# Patient Record
Sex: Female | Born: 2000 | Race: White | Hispanic: No | Marital: Single | Smoking: Never smoker
Health system: Southern US, Community
[De-identification: ages and names within clinical notes are randomized; demographics above are authoritative.]

## PROBLEM LIST (undated history)

## (undated) DIAGNOSIS — R87629 Unspecified abnormal cytological findings in specimens from vagina: Secondary | ICD-10-CM

## (undated) DIAGNOSIS — K219 Gastro-esophageal reflux disease without esophagitis: Secondary | ICD-10-CM

## (undated) DIAGNOSIS — F419 Anxiety disorder, unspecified: Secondary | ICD-10-CM

## (undated) DIAGNOSIS — N289 Disorder of kidney and ureter, unspecified: Secondary | ICD-10-CM

## (undated) HISTORY — DX: Unspecified abnormal cytological findings in specimens from vagina: R87.629

## (undated) HISTORY — PX: MULTIPLE TOOTH EXTRACTIONS: SHX2053

## (undated) HISTORY — DX: Gastro-esophageal reflux disease without esophagitis: K21.9

---
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
## 2018-02-16 ENCOUNTER — Encounter (HOSPITAL_BASED_OUTPATIENT_CLINIC_OR_DEPARTMENT_OTHER): Payer: Self-pay | Admitting: *Deleted

## 2018-02-16 ENCOUNTER — Emergency Department (HOSPITAL_BASED_OUTPATIENT_CLINIC_OR_DEPARTMENT_OTHER)
Admission: EM | Admit: 2018-02-16 | Discharge: 2018-02-17 | Disposition: A | Discharge status: Home / Self Care | Payer: BC Managed Care – PPO | Attending: Emergency Medicine | Admitting: Emergency Medicine

## 2018-02-16 ENCOUNTER — Other Ambulatory Visit: Payer: Self-pay

## 2018-02-16 DIAGNOSIS — R55 Syncope and collapse: Secondary | ICD-10-CM | POA: Insufficient documentation

## 2018-02-16 LAB — CBC WITH DIFFERENTIAL/PLATELET
Abs Immature Granulocytes: 0.02 10*3/uL (ref 0.00–0.07)
Basophils Absolute: 0.1 10*3/uL (ref 0.0–0.1)
Basophils Relative: 1 %
Eosinophils Absolute: 0.1 10*3/uL (ref 0.0–0.5)
Eosinophils Relative: 1 %
HCT: 44.3 % (ref 36.0–46.0)
Hemoglobin: 14.6 g/dL (ref 12.0–15.0)
Immature Granulocytes: 0 %
Lymphocytes Relative: 26 %
Lymphs Abs: 2.4 10*3/uL (ref 0.7–4.0)
MCH: 30.2 pg (ref 26.0–34.0)
MCHC: 33 g/dL (ref 30.0–36.0)
MCV: 91.7 fL (ref 80.0–100.0)
Monocytes Absolute: 1.4 10*3/uL — ABNORMAL HIGH (ref 0.1–1.0)
Monocytes Relative: 15 %
Neutro Abs: 5.2 10*3/uL (ref 1.7–7.7)
Neutrophils Relative %: 57 %
Platelets: 284 10*3/uL (ref 150–400)
RBC: 4.83 MIL/uL (ref 3.87–5.11)
RDW: 12.5 % (ref 11.5–15.5)
WBC: 9.2 10*3/uL (ref 4.0–10.5)
nRBC: 0 % (ref 0.0–0.2)

## 2018-02-16 LAB — BASIC METABOLIC PANEL
Anion gap: 9 (ref 5–15)
BUN: 10 mg/dL (ref 6–20)
CO2: 23 mmol/L (ref 22–32)
Calcium: 9.7 mg/dL (ref 8.9–10.3)
Chloride: 103 mmol/L (ref 98–111)
Creatinine, Ser: 0.68 mg/dL (ref 0.44–1.00)
GFR calc Af Amer: >60 mL/min (ref >60)
GFR calc non Af Amer: >60 mL/min (ref >60)
Glucose, Bld: 83 mg/dL (ref 70–99)
Potassium: 3.6 mmol/L (ref 3.5–5.1)
Sodium: 135 mmol/L (ref 135–145)

## 2018-02-16 LAB — CBG MONITORING, ED: Glucose-Capillary: 76 mg/dL (ref 70–99)

## 2018-02-16 MED ORDER — SODIUM CHLORIDE 0.9 % IV BOLUS
1000.0000 mL | Freq: Once | INTRAVENOUS | Status: AC
  Administered 2018-02-16: 1000 mL via INTRAVENOUS

## 2018-02-16 NOTE — ED Triage Notes (Signed)
Lightheaded. Sudden onset of not feeling good per friends. She denies increased stress. Nausea, sore throat, and abdominal pain. She is flushed. Slow to respond to questions. Pupils equal and slow to react.

## 2018-02-16 NOTE — ED Provider Notes (Signed)
[facility] EMERGENCY DEPARTMENT Provider Note  CSN: [id number] Arrival date & time: 02/16/18 2242  Chief Complaint(s) No chief complaint on file.  HPI Becky Mclaughlin is a 17 y.o. female   The history is provided by the patient.  Dizziness  Quality:  Lightheadedness Severity:  Moderate Onset quality:  Sudden Duration:  1 hour Timing:  Constant Progression:  Unchanged Chronicity:  New Context: medication (took Diflucan yesterday for a yeast infection)   Context: not when bending over, not with bowel movement, not with head movement, not with loss of consciousness, not with physical activity and not when urinating   Context comment:  Reports that she was dying her friend's hair when this started Relieved by:  Nothing Worsened by:  Nothing Associated symptoms: nausea   Associated symptoms: no blood in stool, no diarrhea, no headaches, no palpitations, no shortness of breath, no syncope, no tinnitus, no vomiting and no weakness     LMP: 02/07/18  Past Medical History History reviewed. No pertinent past medical history. There are no active problems to display for this patient.  Home Medication(s) Prior to Admission medications   Not on File                                                                                                                                    Past Surgical History History reviewed. No pertinent surgical history. Family History No family history on file.  Social History Social History   Tobacco Use  . Smoking status: Never Smoker  . Smokeless tobacco: Never Used  Substance Use Topics  . Alcohol use: Never    Frequency: Never  . Drug use: Never   Allergies Patient has no known allergies.  Review of Systems Review of Systems  HENT: Negative for tinnitus.   Respiratory: Negative for shortness of breath.   Cardiovascular: Negative for palpitations and syncope.  Gastrointestinal: Positive for nausea. Negative for blood in stool,  diarrhea and vomiting.  Neurological: Positive for dizziness. Negative for weakness and headaches.   All other systems are reviewed and are negative for acute change except as noted in the HPI  Physical Exam Vital Signs  I have reviewed the triage vital signs BP (!) 154/107 (BP Location: Left Arm)   Pulse (!) 119   Temp 98.3 F (36.8 C) (Oral)   Resp 16   Ht 5\' 9"  (1.753 m)   Wt 57.6 kg   LMP 02/09/2018   SpO2 100%   BMI 18.75 kg/m   Physical Exam Vitals signs reviewed.  Constitutional:      General: She is not in acute distress.    Appearance: She is well-developed. She is not diaphoretic.  HENT:     Head: Normocephalic and atraumatic.     Nose: Nose normal.  Eyes:     General: No scleral icterus.       Right eye: No discharge.  Left eye: No discharge.     Conjunctiva/sclera: Conjunctivae normal.     Pupils: Pupils are equal, round, and reactive to light.  Neck:     Musculoskeletal: Normal range of motion and neck supple.  Cardiovascular:     Rate and Rhythm: Regular rhythm. Tachycardia present.     Heart sounds: No murmur. No friction rub. No gallop.   Pulmonary:     Effort: Pulmonary effort is normal. No respiratory distress.     Breath sounds: Normal breath sounds. No stridor. No rales.  Abdominal:     General: There is no distension.     Palpations: Abdomen is soft.     Tenderness: There is no abdominal tenderness.  Musculoskeletal:        General: No tenderness.  Skin:    General: Skin is warm and dry.     Findings: No erythema or rash.  Neurological:     Mental Status: She is alert and oriented to person, place, and time.     ED Results and Treatments Labs (all labs ordered are listed, but only abnormal results are displayed) Labs Reviewed  CBC WITH DIFFERENTIAL/PLATELET - Abnormal; Notable for the following components:      Result Value   Monocytes Absolute 1.4 (*)    All other components within normal limits  BASIC METABOLIC PANEL    PREGNANCY, URINE  CBG MONITORING, ED  CBG MONITORING, ED                                                                                                                         EKG  EKG Interpretation  Date/Time:  Friday February 16 2018 23:25:11 EST Ventricular Rate:  102 PR Interval:    QRS Duration: 84 QT Interval:  319 QTC Calculation: 416 R Axis:   116 Text Interpretation:  Sinus tachycardia Right axis deviation Low voltage, precordial leads Borderline T abnormalities, diffuse leads No old tracing to compare Confirmed by Drema Pryardama, Pedro [phone]([id number]) on 02/17/2018 2:51:44 AM      Radiology No results found. Pertinent labs & imaging results that were available during my care of the patient were reviewed by me and considered in my medical decision making (see chart for details).  Medications Ordered in ED Medications  sodium chloride 0.9 % bolus 1,000 mL ( Intravenous Stopped 02/17/18 0035)  alum & mag hydroxide-simeth (MAALOX/MYLANTA) 200-200-20 MG/5ML suspension 30 mL (30 mLs Oral Given 02/17/18 0214)  Procedures Procedures  (including critical care time)  Medical Decision Making / ED Course I have reviewed the nursing notes for this encounter and the patient's prior records (if available in EHR or on provided paperwork).    Patient presents with near syncopal episode.  EKG without dysrhythmias or blocks.  No evidence of Brugada or hokum.  Labs grossly reassuring without significant electrolyte derangements or renal insufficiency.  No anemia.  No leukocytosis.  CBGs reassuring.  UPT negative.  Blood pressure and orthostatics reassuring.  Patient is mildly tachycardic.  She was provided with IV fluids.  During work-up she complained of epigastric discomfort which improved after GI cocktail.  Following treatment, tachycardia resolved.  Patient improved  symptomatically and was able to ambulate without complication.  After several hours of monitoring, felt the patient was stable for discharge home with strict return precautions.  The patient appears reasonably screened and/or stabilized for discharge and I doubt any other medical condition or other [facility] requiring further screening, evaluation, or treatment in the ED at this time prior to discharge.   Final Clinical Impression(s) / ED Diagnoses Final diagnoses:  Vasovagal near syncope    Disposition: Discharge  Condition: Good  I have discussed the results, Dx and Tx plan with the patient who expressed understanding and agree(s) with the plan. Discharge instructions discussed at great length. The patient was given strict return precautions who verbalized understanding of the instructions. No further questions at time of discharge.    ED Discharge Orders    None       Follow Up: Iona HansenJones, Penny L, NP [address] [place] [address] [place] [id number] [phone]  Schedule an appointment as soon as possible for a visit  As needed     This chart was dictated using voice recognition software.  Despite best efforts to proofread,  errors can occur which can change the documentation meaning.   Nira Connardama, Pedro Eduardo, MD 02/17/18 [phone]

## 2018-02-17 LAB — PREGNANCY, URINE: Preg Test, Ur: NEGATIVE

## 2018-02-17 LAB — CBG MONITORING, ED: Glucose-Capillary: 91 mg/dL (ref 70–99)

## 2018-02-17 MED ORDER — ALUM & MAG HYDROXIDE-SIMETH 200-200-20 MG/5ML PO SUSP
30.0000 mL | Freq: Once | ORAL | Status: AC
  Administered 2018-02-17: 30 mL via ORAL
  Filled 2018-02-17: qty 30

## 2018-02-23 ENCOUNTER — Other Ambulatory Visit: Payer: Self-pay

## 2018-02-23 ENCOUNTER — Encounter (HOSPITAL_BASED_OUTPATIENT_CLINIC_OR_DEPARTMENT_OTHER): Payer: Self-pay

## 2018-02-23 ENCOUNTER — Emergency Department (HOSPITAL_BASED_OUTPATIENT_CLINIC_OR_DEPARTMENT_OTHER)
Admission: EM | Admit: 2018-02-23 | Discharge: 2018-02-23 | Disposition: A | Discharge status: Home / Self Care | Payer: BC Managed Care – PPO | Attending: Emergency Medicine | Admitting: Emergency Medicine

## 2018-02-23 ENCOUNTER — Emergency Department (HOSPITAL_BASED_OUTPATIENT_CLINIC_OR_DEPARTMENT_OTHER): Payer: BC Managed Care – PPO

## 2018-02-23 DIAGNOSIS — R079 Chest pain, unspecified: Secondary | ICD-10-CM | POA: Diagnosis not present

## 2018-02-23 LAB — CBC WITH DIFFERENTIAL/PLATELET
Abs Immature Granulocytes: 0.01 10*3/uL (ref 0.00–0.07)
Basophils Absolute: 0 10*3/uL (ref 0.0–0.1)
Basophils Relative: 0 %
Eosinophils Absolute: 0 10*3/uL (ref 0.0–0.5)
Eosinophils Relative: 0 %
HCT: 47 % — ABNORMAL HIGH (ref 36.0–46.0)
Hemoglobin: 15.5 g/dL — ABNORMAL HIGH (ref 12.0–15.0)
Immature Granulocytes: 0 %
Lymphocytes Relative: 37 %
Lymphs Abs: 2.1 10*3/uL (ref 0.7–4.0)
MCH: 30 pg (ref 26.0–34.0)
MCHC: 33 g/dL (ref 30.0–36.0)
MCV: 90.9 fL (ref 80.0–100.0)
Monocytes Absolute: 0.6 10*3/uL (ref 0.1–1.0)
Monocytes Relative: 10 %
Neutro Abs: 3 10*3/uL (ref 1.7–7.7)
Neutrophils Relative %: 53 %
Platelets: 267 10*3/uL (ref 150–400)
RBC: 5.17 MIL/uL — ABNORMAL HIGH (ref 3.87–5.11)
RDW: 12.5 % (ref 11.5–15.5)
WBC: 5.7 10*3/uL (ref 4.0–10.5)
nRBC: 0 % (ref 0.0–0.2)

## 2018-02-23 LAB — COMPREHENSIVE METABOLIC PANEL
ALT: 12 U/L (ref 0–44)
AST: 21 U/L (ref 15–41)
Albumin: 4.1 g/dL (ref 3.5–5.0)
Alkaline Phosphatase: 67 U/L (ref 38–126)
Anion gap: 9 (ref 5–15)
BUN: 9 mg/dL (ref 6–20)
CO2: 23 mmol/L (ref 22–32)
Calcium: 9.5 mg/dL (ref 8.9–10.3)
Chloride: 105 mmol/L (ref 98–111)
Creatinine, Ser: 0.62 mg/dL (ref 0.44–1.00)
GFR calc Af Amer: >60 mL/min (ref >60)
GFR calc non Af Amer: >60 mL/min (ref >60)
Glucose, Bld: 82 mg/dL (ref 70–99)
Potassium: 4 mmol/L (ref 3.5–5.1)
Sodium: 137 mmol/L (ref 135–145)
Total Bilirubin: 0.5 mg/dL (ref 0.3–1.2)
Total Protein: 7.6 g/dL (ref 6.5–8.1)

## 2018-02-23 LAB — HCG, SERUM, QUALITATIVE: Preg, Serum: NEGATIVE

## 2018-02-23 LAB — PREGNANCY, URINE: Preg Test, Ur: NEGATIVE

## 2018-02-23 LAB — D-DIMER, QUANTITATIVE: D-Dimer, Quant: <0.27 ug/mL-FEU (ref 0.00–0.50)

## 2018-02-23 MED ORDER — IOPAMIDOL (ISOVUE-370) INJECTION 76%
100.0000 mL | Freq: Once | INTRAVENOUS | Status: AC | PRN
  Administered 2018-02-23: 57 mL via INTRAVENOUS

## 2018-02-23 NOTE — ED Triage Notes (Signed)
Pt c/o left side chest pain that started last night, pt is uncertain if she is having SOB or dizziness, was seen at the [facility] and had an elevated d-dimer

## 2018-02-23 NOTE — ED Notes (Signed)
Pt and family understood dc material. NAD noted 

## 2018-02-23 NOTE — Discharge Instructions (Addendum)
Your CT chest today was negative for a pulmonary embolism. Please follow up with your pediatrician as needed.

## 2018-02-23 NOTE — ED Provider Notes (Signed)
[facility] EMERGENCY DEPARTMENT Provider Note   CSN: [id number] Arrival date & time: 02/23/18  1508     History   Chief Complaint Chief Complaint  Patient presents with  . Abnormal Lab    HPI Becky Mclaughlin is a 18 y.o. female.  18 y.o female with a PMH of GERD presents to the ED with a chief complaint of chest pain which began last night while watching television. She reports some bilateral hand tingling. She reports the pain is worse with taking a deep breath.  Patient has not tried taking any medication for relief of her chest pain.  She does however report she takes omeprazole for acid reflux which she took today.  Patient is currently on oral contraceptives which were started in November, she denies any recent immobilization aside from a long car ride to [place] back in November.  Ports her pain is currently at rest and while walking.  He denies any fever, chills, shortness of breath or nausea vomiting.     History reviewed. No pertinent past medical history.  There are no active problems to display for this patient.   History reviewed. No pertinent surgical history.   OB History   No obstetric history on file.      Home Medications    Prior to Admission medications   Not on File    Family History No family history on file.  Social History Social History   Tobacco Use  . Smoking status: Never Smoker  . Smokeless tobacco: Never Used  Substance Use Topics  . Alcohol use: Never    Frequency: Never  . Drug use: Never     Allergies   Patient has no known allergies.   Review of Systems Review of Systems  Constitutional: Negative for chills and fever.  HENT: Negative for ear pain and sore throat.   Eyes: Negative for pain and visual disturbance.  Respiratory: Negative for cough and shortness of breath.   Cardiovascular: Positive for chest pain. Negative for palpitations and leg swelling.  Gastrointestinal: Negative for abdominal pain and  vomiting.  Genitourinary: Negative for dysuria and hematuria.  Musculoskeletal: Negative for arthralgias and back pain.  Skin: Negative for color change and rash.  Neurological: Negative for seizures and syncope.  All other systems reviewed and are negative.    Physical Exam Updated Vital Signs BP (!) 136/93 (BP Location: Left Arm)   Pulse 86   Temp 98.7 F (37.1 C) (Oral)   Resp 18   Ht 5\' 9"  (1.753 m)   Wt 56.7 kg   LMP 02/06/2018   SpO2 100%   BMI 18.46 kg/m   Physical Exam Vitals signs and nursing note reviewed.  Constitutional:      General: She is not in acute distress.    Appearance: She is well-developed.  HENT:     Head: Normocephalic and atraumatic.     Mouth/Throat:     Pharynx: No oropharyngeal exudate.  Eyes:     Pupils: Pupils are equal, round, and reactive to light.  Neck:     Musculoskeletal: Normal range of motion.  Cardiovascular:     Rate and Rhythm: Regular rhythm.     Heart sounds: Normal heart sounds.  Pulmonary:     Effort: Pulmonary effort is normal. No respiratory distress.     Breath sounds: Normal breath sounds.  Chest:     Comments: Pain is not reproducible with palpation. Abdominal:     General: Bowel sounds are normal. There is  no distension.     Palpations: Abdomen is soft.     Tenderness: There is no abdominal tenderness.  Musculoskeletal:        General: No tenderness or deformity.     Right lower leg: No edema.     Left lower leg: No edema.  Skin:    General: Skin is warm and dry.  Neurological:     Mental Status: She is alert and oriented to person, place, and time.      ED Treatments / Results  Labs (all labs ordered are listed, but only abnormal results are displayed) Labs Reviewed  CBC WITH DIFFERENTIAL/PLATELET - Abnormal; Notable for the following components:      Result Value   RBC 5.17 (*)    Hemoglobin 15.5 (*)    HCT 47.0 (*)    All other components within normal limits  PREGNANCY, URINE  HCG, SERUM,  QUALITATIVE  COMPREHENSIVE METABOLIC PANEL    EKG None  Radiology Ct Angio Chest Pe W And/or Wo Contrast  Addendum Date: 02/23/2018   ADDENDUM REPORT: 02/23/2018 18:13 ADDENDUM: This addendum is given for the purpose of noting the patient's aorta is normal in appearance without dissection or aneurysm. Electronically Signed   By: Drusilla Kannerhomas  Dalessio M.D.   On: 02/23/2018 18:13   Result Date: 02/23/2018 CLINICAL DATA:  Left chest pain, elevated D-dimer and near syncopal episode. Chest pain last night. EXAM: CT ANGIOGRAPHY CHEST WITH CONTRAST TECHNIQUE: Multidetector CT imaging of the chest was performed using the standard protocol during bolus administration of intravenous contrast. Multiplanar CT image reconstructions and MIPs were obtained to evaluate the vascular anatomy. CONTRAST:  57 mL ISOVUE-370 IOPAMIDOL (ISOVUE-370) INJECTION 76% COMPARISON:  None. FINDINGS: Cardiovascular: Satisfactory opacification of the pulmonary arteries to the segmental level. No evidence of pulmonary embolism. Normal heart size. No pericardial effusion. Mediastinum/Nodes: No enlarged mediastinal, hilar, or axillary lymph nodes. Thyroid gland, trachea, and esophagus demonstrate no significant findings. Lungs/Pleura: Lungs are clear. No pleural effusion or pneumothorax. Upper Abdomen: Negative. Musculoskeletal: Negative. Review of the MIP images confirms the above findings. IMPRESSION: Negative for pulmonary embolus.  Normal chest CT. Electronically Signed: By: Drusilla Kannerhomas  Dalessio M.D. On: 02/23/2018 17:43    Procedures Procedures (including critical care time)  Medications Ordered in ED Medications  iopamidol (ISOVUE-370) 76 % injection 100 mL (57 mLs Intravenous Contrast Given 02/23/18 1720)     Initial Impression / Assessment and Plan / ED Course  I have reviewed the triage vital signs and the nursing notes.  Pertinent labs & imaging results that were available during my care of the patient were reviewed by me and  considered in my medical decision making (see chart for details).    Presents with pain since last night and a near syncopal episode.  She is on birth control but has recently stopped them.  States her pain is worse with deep inspiration.  She was seen at [facility] urgent [facility] this evening had an elevated d-dimer of 5710, CT angio chest to rule out PE was ordered during her visit. CBC showed no leukocytosis, CMP showed no electrolyte abnormality.  Urine pregnancy was negative, her EKG no acute changes consistent with infarct or STEMI.  CT angio chest showed: Negative for pulmonary embolus. Normal chest CT. This addendum is given for the purpose of noting the patient's aorta is normal in appearance without dissection or aneurysm.   Final Clinical Impressions(s) / ED Diagnoses   Final diagnoses:  Chest pain, unspecified type    ED  Discharge Orders    None       Claude MangesSoto, , Cordelia Poche-C 02/23/18 [id number]    Campos, Kevin, MD 02/25/18 [phone]

## 2018-03-07 ENCOUNTER — Other Ambulatory Visit: Payer: Self-pay | Admitting: Physician Assistant

## 2018-03-07 DIAGNOSIS — R110 Nausea: Secondary | ICD-10-CM

## 2018-03-07 DIAGNOSIS — R1010 Upper abdominal pain, unspecified: Secondary | ICD-10-CM

## 2018-03-07 DIAGNOSIS — R142 Eructation: Secondary | ICD-10-CM

## 2018-03-13 ENCOUNTER — Other Ambulatory Visit: Payer: BC Managed Care – PPO

## 2018-03-16 ENCOUNTER — Other Ambulatory Visit: Payer: BC Managed Care – PPO

## 2018-03-16 ENCOUNTER — Inpatient Hospital Stay: Admission: RE | Admit: 2018-03-16 | Payer: BC Managed Care – PPO | Source: Ambulatory Visit

## 2018-03-21 DIAGNOSIS — R519 Headache, unspecified: Secondary | ICD-10-CM | POA: Insufficient documentation

## 2018-03-23 ENCOUNTER — Ambulatory Visit
Admission: RE | Admit: 2018-03-23 | Discharge: 2018-03-23 | Disposition: A | Discharge status: Home / Self Care | Payer: BC Managed Care – PPO | Source: Ambulatory Visit | Attending: Physician Assistant | Admitting: Physician Assistant

## 2018-03-23 DIAGNOSIS — R110 Nausea: Secondary | ICD-10-CM

## 2018-03-23 DIAGNOSIS — R142 Eructation: Secondary | ICD-10-CM

## 2018-03-23 DIAGNOSIS — R1010 Upper abdominal pain, unspecified: Secondary | ICD-10-CM

## 2018-05-09 DIAGNOSIS — K219 Gastro-esophageal reflux disease without esophagitis: Secondary | ICD-10-CM | POA: Insufficient documentation

## 2018-10-21 NOTE — Progress Notes (Signed)
Patient referred by Berkley Harvey, NP for chest pain  Subjective:   Becky Mclaughlin, female    DOB: [birth year], 18 y.o.   MRN: [id number]   Chief Complaint  Patient presents with  . Chest Pain     HPI  18 y.o. Caucasian female with chest pain, lighthheadedness  Patient is a Ship broker at [employer], currently enrolled in virtual classes.  She lives with her mother.  She has had episodes of left-sided sharp chest pain lasting for few seconds to minutes, unrelated to exertion.  She has also had episodes of sudden onset lightheadedness with palpitations, but improved with laying down in supine position.  She has never had a syncope.  Patient has undergone CT scan of her chest in January after an ED visit that showed no significant abnormalities.  Her EKG has historically showed inferior T wave inversions with no change.   Past Medical History:  Diagnosis Date  . Acid reflux      History reviewed. No pertinent surgical history.   Social History   Socioeconomic History  . Marital status: Single    Spouse name: Not on file  . Number of children: Not on file  . Years of education: Not on file  . Highest education level: Not on file  Occupational History  . Not on file  Social Needs  . Financial resource strain: Not on file  . Food insecurity    Worry: Not on file    Inability: Not on file  . Transportation needs    Medical: Not on file    Non-medical: Not on file  Tobacco Use  . Smoking status: Never Smoker  . Smokeless tobacco: Never Used  Substance and Sexual Activity  . Alcohol use: Never    Frequency: Never  . Drug use: Never  . Sexual activity: Not on file  Lifestyle  . Physical activity    Days per week: Not on file    Minutes per session: Not on file  . Stress: Not on file  Relationships  . Social Herbalist on phone: Not on file    Gets together: Not on file    Attends religious service: Not on file    Active member of club or organization: Not on  file    Attends meetings of clubs or organizations: Not on file    Relationship status: Not on file  . Intimate partner violence    Fear of current or ex partner: Not on file    Emotionally abused: Not on file    Physically abused: Not on file    Forced sexual activity: Not on file  Other Topics Concern  . Not on file  Social History Narrative  . Not on file     Family History  Problem Relation Age of Onset  . Hypertension Mother        "had hole in her heart"  . Hypertension Father   . Stroke Cousin        At age 93., used to vape    Current Outpatient Medications on File Prior to Visit  Medication Sig Dispense Refill  . omeprazole (PRILOSEC) 40 MG capsule Take 1 capsule by mouth 2 (two) times daily.     No current facility-administered medications on file prior to visit.     Cardiovascular studies:  EKG 10/22/2018: Sinus rhythm 67 bpm. Diffuse nonspecific T-abnormality.   CTA chest 02/23/2018: Negative for pulmonary embolus. Normal chest CT. Normal aorta.  Recent labs: 10/05/2018: Glucose 82. BUN/Cr 10/0.75. eGFR norma. Na/K 137/4.0 Rest of the CMP normal.   Results for Becky Mclaughlin (MRN [id number]) as of 10/21/2018 22:31  Ref. Range 02/23/2018 16:15  Sodium Latest Ref Range: 135 - 145 mmol/L 137  Potassium Latest Ref Range: 3.5 - 5.1 mmol/L 4.0  Chloride Latest Ref Range: 98 - 111 mmol/L 105  CO2 Latest Ref Range: 22 - 32 mmol/L 23  Glucose Latest Ref Range: 70 - 99 mg/dL 82  BUN Latest Ref Range: 6 - 20 mg/dL 9  Creatinine Latest Ref Range: 0.44 - 1.00 mg/dL 0.62  Calcium Latest Ref Range: 8.9 - 10.3 mg/dL 9.5  Anion gap Latest Ref Range: 5 - 15  9  Alkaline Phosphatase Latest Ref Range: 38 - 126 U/L 67  Albumin Latest Ref Range: 3.5 - 5.0 g/dL 4.1  AST Latest Ref Range: 15 - 41 U/L 21  ALT Latest Ref Range: 0 - 44 U/L 12  Total Protein Latest Ref Range: 6.5 - 8.1 g/dL 7.6  Total Bilirubin Latest Ref Range: 0.3 - 1.2 mg/dL 0.5  GFR, Est Non African  American Latest Ref Range: >60 mL/min >60  GFR, Est African American Latest Ref Range: >60 mL/min >60   Results for Becky Mclaughlin (MRN [id number]) as of 10/21/2018 22:31  Ref. Range 02/23/2018 16:15  WBC Latest Ref Range: 4.0 - 10.5 K/uL 5.7  RBC Latest Ref Range: 3.87 - 5.11 MIL/uL 5.17 (H)  Hemoglobin Latest Ref Range: 12.0 - 15.0 g/dL 15.5 (H)  HCT Latest Ref Range: 36.0 - 46.0 % 47.0 (H)  MCV Latest Ref Range: 80.0 - 100.0 fL 90.9  MCH Latest Ref Range: 26.0 - 34.0 pg 30.0  MCHC Latest Ref Range: 30.0 - 36.0 g/dL 33.0  RDW Latest Ref Range: 11.5 - 15.5 % 12.5  Platelets Latest Ref Range: 150 - 400 K/uL 267  nRBC Latest Ref Range: 0.0 - 0.2 % 0.0   Results for Becky Mclaughlin (MRN [id number]) as of 10/21/2018 22:31  Ref. Range 02/23/2018 16:15  D-Dimer, Quant Latest Ref Range: 0.00 - 0.50 ug/mL-FEU <0.27    Review of Systems  Constitution: Negative for decreased appetite, malaise/fatigue, weight gain and weight loss.  HENT: Negative for congestion.   Eyes: Negative for visual disturbance.  Cardiovascular: Positive for chest pain and palpitations. Negative for dyspnea on exertion, leg swelling and syncope.  Respiratory: Negative for cough.   Endocrine: Negative for cold intolerance.  Hematologic/Lymphatic: Does not bruise/bleed easily.  Skin: Negative for itching and rash.  Musculoskeletal: Negative for myalgias.  Gastrointestinal: Negative for abdominal pain, nausea and vomiting.  Genitourinary: Negative for dysuria.  Neurological: Positive for light-headedness. Negative for dizziness and weakness.  Psychiatric/Behavioral: The patient is not nervous/anxious.   All other systems reviewed and are negative.      Vitals:   10/22/18 1038  BP: 118/82  Pulse: 83  Temp: 97.8 F (36.6 C)  SpO2: 99%     Body mass index is 18.9 kg/m. Filed Weights   10/22/18 1038  Weight: 128 lb (58.1 kg)     Objective:   Physical Exam  Constitutional: She is oriented to person, place, and  time. She appears well-developed and well-nourished. No distress.  HENT:  Head: Normocephalic and atraumatic.  Eyes: Pupils are equal, round, and reactive to light. Conjunctivae are normal.  Neck: No JVD present.  Cardiovascular: Normal rate, regular rhythm and intact distal pulses.  Pulmonary/Chest: Effort normal and breath sounds normal. She has no wheezes. She has no  rales.  Abdominal: Soft. Bowel sounds are normal. There is no rebound.  Musculoskeletal:        General: No edema.  Lymphadenopathy:    She has no cervical adenopathy.  Neurological: She is alert and oriented to person, place, and time. No cranial nerve deficit.  Skin: Skin is warm and dry.  Psychiatric: She has a normal mood and affect.  Nursing note and vitals reviewed.         Assessment & Recommendations:   18 y.o. Caucasian female with chest pain, lighthheadedness  Chest pain: Atypical in nature.  Recommend treadmill stress test to reassure the patient regarding her exercise capacity and rule out any significant cardiac abnormalities.  Given her baseline given the lesions on EKG, will obtain echocardiogram.  Lightheadedness: Suspect her episodes are vasovagal in nature.  Discussed the mechanism and recommended liberal hydration, use of compression stockings.  We also discussed counterpressure maneuvers.   Thank you for referring the patient to [place]. Please feel free to contact with any questions.  Nigel Mormon, MD [facility] Pager: [phone] Office: [phone] If no answer Cell [phone]

## 2018-10-22 ENCOUNTER — Ambulatory Visit (INDEPENDENT_AMBULATORY_CARE_PROVIDER_SITE_OTHER): Payer: BC Managed Care – PPO | Admitting: Cardiology

## 2018-10-22 ENCOUNTER — Encounter: Payer: Self-pay | Admitting: Cardiology

## 2018-10-22 ENCOUNTER — Other Ambulatory Visit: Payer: Self-pay

## 2018-10-22 VITALS — BP 118/82 | HR 83 | Temp 97.8°F | Ht 69.0 in | Wt 128.0 lb

## 2018-10-22 DIAGNOSIS — R42 Dizziness and giddiness: Secondary | ICD-10-CM

## 2018-10-22 DIAGNOSIS — R079 Chest pain, unspecified: Secondary | ICD-10-CM | POA: Insufficient documentation

## 2018-10-22 DIAGNOSIS — R072 Precordial pain: Secondary | ICD-10-CM | POA: Insufficient documentation

## 2018-10-24 ENCOUNTER — Telehealth: Payer: Self-pay

## 2018-10-24 NOTE — Telephone Encounter (Signed)
Telephone encounter:  Reason for call: should patient stop newly prescribe hydroxyzine prior to upcoming Echo & Nuclear Stress Test  Usual provider: MP   Last office visit: 10/22/18    Next office visit: echo/nuc    Last hospitalization: 02/16/18     Current Outpatient Medications on File Prior to Visit  Medication Sig Dispense Refill  . hydrOXYzine (ATARAX/VISTARIL) 10 MG tablet Take 10 mg by mouth daily.    Marland Kitchen omeprazole (PRILOSEC) 40 MG capsule Take 1 capsule by mouth 2 (two) times daily.     No current facility-administered medications on file prior to visit.

## 2018-10-24 NOTE — Telephone Encounter (Signed)
S/w pt advised her pf above

## 2018-10-31 ENCOUNTER — Other Ambulatory Visit: Payer: Self-pay | Admitting: Physician Assistant

## 2018-10-31 DIAGNOSIS — R1010 Upper abdominal pain, unspecified: Secondary | ICD-10-CM

## 2018-10-31 DIAGNOSIS — R1900 Intra-abdominal and pelvic swelling, mass and lump, unspecified site: Secondary | ICD-10-CM

## 2018-11-02 ENCOUNTER — Other Ambulatory Visit: Payer: Self-pay

## 2018-11-02 ENCOUNTER — Emergency Department (HOSPITAL_BASED_OUTPATIENT_CLINIC_OR_DEPARTMENT_OTHER)
Admission: EM | Admit: 2018-11-02 | Discharge: 2018-11-02 | Disposition: A | Discharge status: Home / Self Care | Payer: BC Managed Care – PPO | Attending: Emergency Medicine | Admitting: Emergency Medicine

## 2018-11-02 ENCOUNTER — Emergency Department (HOSPITAL_BASED_OUTPATIENT_CLINIC_OR_DEPARTMENT_OTHER)
Admission: EM | Admit: 2018-11-02 | Discharge: 2018-11-02 | Disposition: A | Discharge status: Home / Self Care | Payer: BC Managed Care – PPO | Source: Home / Self Care | Attending: Emergency Medicine | Admitting: Emergency Medicine

## 2018-11-02 ENCOUNTER — Emergency Department (HOSPITAL_BASED_OUTPATIENT_CLINIC_OR_DEPARTMENT_OTHER): Payer: BC Managed Care – PPO

## 2018-11-02 ENCOUNTER — Encounter (HOSPITAL_BASED_OUTPATIENT_CLINIC_OR_DEPARTMENT_OTHER): Payer: Self-pay | Admitting: *Deleted

## 2018-11-02 ENCOUNTER — Encounter (HOSPITAL_BASED_OUTPATIENT_CLINIC_OR_DEPARTMENT_OTHER): Payer: Self-pay | Admitting: Emergency Medicine

## 2018-11-02 DIAGNOSIS — R0789 Other chest pain: Secondary | ICD-10-CM | POA: Insufficient documentation

## 2018-11-02 DIAGNOSIS — R1013 Epigastric pain: Secondary | ICD-10-CM

## 2018-11-02 DIAGNOSIS — R002 Palpitations: Secondary | ICD-10-CM

## 2018-11-02 DIAGNOSIS — Z79899 Other long term (current) drug therapy: Secondary | ICD-10-CM | POA: Diagnosis not present

## 2018-11-02 DIAGNOSIS — R1900 Intra-abdominal and pelvic swelling, mass and lump, unspecified site: Secondary | ICD-10-CM | POA: Insufficient documentation

## 2018-11-02 LAB — URINALYSIS, ROUTINE W REFLEX MICROSCOPIC
Bilirubin Urine: NEGATIVE
Glucose, UA: NEGATIVE mg/dL
Hgb urine dipstick: NEGATIVE
Ketones, ur: 15 mg/dL — AB
Leukocytes,Ua: NEGATIVE
Nitrite: NEGATIVE
Protein, ur: NEGATIVE mg/dL
Specific Gravity, Urine: 1.025 (ref 1.005–1.030)
pH: 6 (ref 5.0–8.0)

## 2018-11-02 LAB — PREGNANCY, URINE: Preg Test, Ur: NEGATIVE

## 2018-11-02 MED ORDER — LIDOCAINE VISCOUS HCL 2 % MT SOLN
15.0000 mL | Freq: Once | OROMUCOSAL | Status: DC
  Filled 2018-11-02: qty 15

## 2018-11-02 MED ORDER — ALUM & MAG HYDROXIDE-SIMETH 200-200-20 MG/5ML PO SUSP
30.0000 mL | Freq: Once | ORAL | Status: DC
  Filled 2018-11-02: qty 30

## 2018-11-02 NOTE — ED Notes (Signed)
Patient declined discharge vitals and signature. Caught up to patient at registration to deliver discharge paperwork.

## 2018-11-02 NOTE — ED Notes (Signed)
PT does not want EKG.

## 2018-11-02 NOTE — Discharge Instructions (Signed)
Your ultrasound today shows no evidence of aneurysm or other abnormalities.  Please follow-up with your GI doctor and cardiologist for continued evaluation of the symptoms.

## 2018-11-02 NOTE — ED Provider Notes (Signed)
[facility] EMERGENCY DEPARTMENT Provider Note   CSN: [id number] Arrival date & time: 11/02/18  1402     History   Chief Complaint Chief Complaint  Patient presents with  . Abdominal Pain    HPI Evolett Kotlarz is a 18 y.o. female with no significant past medical history presenting to the ED with intermittent chest discomfort for past two weeks with heightened awareness of her heartbeat which she reports to be more rapid during these episodes. These episodes subside within 15-20 minutes. She has also been having epigastric discomfort for this duration that is more prominent when she arches her back and presses down on her abdomen. She believes that she has a bulge in her abdomen and is able to feel her aorta through this. She reports that she was told by her GI doctor that she has an abdominal aneurysm. She denies any headaches, dizziness, shortness of breath, diaphoresis, nausea, vomiting, or changes in bowel habits.     Past Medical History:  Diagnosis Date  . Acid reflux     Patient Active Problem List   Diagnosis Date Noted  . Chest pain 10/22/2018  . Lightheadedness 10/22/2018    History reviewed. No pertinent surgical history.   OB History   No obstetric history on file.      Home Medications    Prior to Admission medications   Medication Sig Start Date End Date Taking? Authorizing Provider  famotidine (PEPCID) 10 MG tablet Take 10 mg by mouth 2 (two) times daily.    [provider]  omeprazole (PRILOSEC) 40 MG capsule  11/02/18   [provider]    Family History Family History  Problem Relation Age of Onset  . Hypertension Mother        "had hole in her heart"  . Hypertension Father   . Stroke Cousin        At age 78., used to vape    Social History Social History   Tobacco Use  . Smoking status: Never Smoker  . Smokeless tobacco: Never Used  Substance Use Topics  . Alcohol use: Yes    Frequency: Never    Comment:  occasional  . Drug use: Never     Allergies   Fluconazole   Review of Systems Review of Systems  Constitutional: Negative for appetite change, chills and fever.  HENT: Negative for congestion and sore throat.   Eyes: Negative for pain and visual disturbance.  Respiratory: Positive for chest tightness. Negative for cough, shortness of breath and wheezing.   Cardiovascular: Positive for palpitations. Negative for chest pain.  Gastrointestinal: Positive for abdominal pain. Negative for abdominal distention, constipation, diarrhea, nausea and vomiting.  Endocrine: Negative.   Genitourinary: Negative.   Musculoskeletal: Negative.   Skin: Negative.   Allergic/Immunologic: Negative.   Neurological: Negative for dizziness, weakness, numbness and headaches.  Hematological: Negative.   Psychiatric/Behavioral: Negative.    Physical Exam Updated Vital Signs BP 120/90   Pulse 87   Temp 98.4 F (36.9 C)   Resp 16   Ht 5\' 9"  (1.753 m)   Wt 55 kg   LMP 10/23/2018 (Approximate) Comment: (-) urine pregnancy//ac  SpO2 100%   BMI 17.91 kg/m   Physical Exam Constitutional:      General: She is not in acute distress.    Appearance: She is well-developed and normal weight. She is not diaphoretic.  HENT:     Head: Normocephalic and atraumatic.     Mouth/Throat:     Mouth:  Mucous membranes are moist.     Pharynx: Oropharynx is clear.  Cardiovascular:     Rate and Rhythm: Normal rate and regular rhythm.     Heart sounds: Normal heart sounds. No murmur. No friction rub. No gallop.   Pulmonary:     Effort: Pulmonary effort is normal. No respiratory distress.     Breath sounds: Normal breath sounds. No wheezing, rhonchi or rales.  Chest:     Chest wall: No tenderness.  Abdominal:     General: Abdomen is flat. Bowel sounds are normal. There is no distension or abdominal bruit.     Palpations: Abdomen is soft. There is no mass.     Tenderness: There is abdominal tenderness in the  epigastric area. There is no guarding or rebound.     Hernia: No hernia is present.  Skin:    General: Skin is warm and dry.     Capillary Refill: Capillary refill takes less than 2 seconds.  Neurological:     General: No focal deficit present.     Mental Status: She is alert and oriented to person, place, and time.     Cranial Nerves: No cranial nerve deficit.     Motor: No weakness.    ED Treatments / Results  Labs (all labs ordered are listed, but only abnormal results are displayed) Labs Reviewed - No data to display  EKG None  Radiology Dg Abd Acute 2+v W 1v Chest  Result Date: 11/02/2018 CLINICAL DATA:  Mid abdominal pain, chest pain EXAM: DG ABDOMEN ACUTE W/ 1V CHEST COMPARISON:  None. FINDINGS: There is no evidence of dilated bowel loops or free intraperitoneal air. No radiopaque calculi or other significant radiographic abnormality is seen. Heart size and mediastinal contours are within normal limits. Both lungs are clear. IMPRESSION: Negative abdominal radiographs.  No acute cardiopulmonary disease. Electronically Signed   By: Rolm Baptise M.D.   On: 11/02/2018 03:43    Procedures Procedures (including critical care time)  Medications Ordered in ED Medications - No data to display   Initial Impression / Assessment and Plan / ED Course  I have reviewed the triage vital signs and the nursing notes.  Pertinent labs & imaging results that were available during my care of the patient were reviewed by me and considered in my medical decision making (see chart for details).  Patient is a 18yo female presenting with vague symptoms of intermittent chest pain, palpitations and epigastric abdominal pain and being able to palpate a bulge in her stomach that she believes to be an aortic aneurysm. Patient was seen for these concerns overnight by ED provider. At the time, patient refused EKG due to having an EKG done in the past that has been "normal". She has an upcoming abdominal  ultrasound on 9/16 and was encouraged to follow up with that as well as cardiology recommendations for treadmill stress test and echo. Patient continues to have concerns of chest discomfort and palpitations and being able to palpate bulge in her stomach, insisting on an abdominal US. She continues to refuse EKG.  She is hemodynamically stable on examination. No significant findings on physical exam. US abdomen ordered.     Patient signed out to Benedetto Goad, [place] who will follow up on US Abdomen results. Pending results, patient is likely safe for discharge to home with cardiology and PCP follow up.   Final Clinical Impressions(s) / ED Diagnoses   Final diagnoses:  None    ED Discharge Orders  None       Eliezer BottomAslam, , MD 11/02/18 1511    Jacalyn LefevreHaviland, Julie, MD 11/04/18 [phone]

## 2018-11-02 NOTE — ED Notes (Signed)
ED Provider at bedside. 

## 2018-11-02 NOTE — ED Provider Notes (Signed)
[facility] Provider Note: Becky Spurling, MD, FACEP  CSN: [id number] MRN: [id number] ARRIVAL: 11/02/18 at [place]: [place]  Chest Pain and Abdominal Pain   HISTORY OF PRESENT ILLNESS  11/02/18 2:51 AM Becky Mclaughlin is a 18 y.o. female who complains of a vague chest discomfort that has been occurring off and on for the past 2 weeks.  She has also been having episodes of palpitations (rapid heartbeat) which she thinks, but is not completely sure, occurs at the same time as her chest tightness.  She denies associated shortness of breath, diaphoresis, nausea, vomiting or diarrhea.  She is also been having epigastric discomfort for about a month.  It is not always present at rest but is present when her epigastrium is pressed.  She is not sure if it correlates with her palpitations and chest discomfort.  It is not worse with eating.  She is vague about the nature of this pain and she also believes she has a bulge in the middle of her abdomen which her boyfriend states has been more prominent in the past.  She denies constipation, dysuria, vaginal bleeding or vaginal discharge.  She refuses an EKG because she states she has had EKGs in the past, most recently 2 weeks ago, and they have always been normal.  She rates the pain in her chest as an 8 out of 10 when it is occurring but states it is not occurring at the present time.  She is currently on famotidine having recently switched from omeprazole to full 40 mg twice daily.  She was having the above symptoms even before she changed medications.    Past Medical History:  Diagnosis Date  . Acid reflux     History reviewed. No pertinent surgical history.  Family History  Problem Relation Age of Onset  . Hypertension Mother        "had hole in her heart"  . Hypertension Father   . Stroke Cousin        At age 57., used to vape    Social History   Tobacco Use  . Smoking status: Never Smoker  . Smokeless  tobacco: Never Used  Substance Use Topics  . Alcohol use: Yes    Frequency: Never    Comment: occasional  . Drug use: Never    Prior to Admission medications   Medication Sig Start Date End Date Taking? Authorizing Provider  famotidine (PEPCID) 10 MG tablet Take 10 mg by mouth 2 (two) times daily.   Yes [provider]  hydrOXYzine (ATARAX/VISTARIL) 10 MG tablet Take 10 mg by mouth daily.    [provider]  omeprazole (PRILOSEC) 40 MG capsule Take 1 capsule by mouth 2 (two) times daily. 09/27/18   [provider]    Allergies Fluconazole   REVIEW OF SYSTEMS  Negative except as noted here or in the History of Present Illness.   PHYSICAL EXAMINATION  Initial Vital Signs Blood pressure (!) 142/105, pulse 83, temperature 98.5 F (36.9 C), temperature source Oral, resp. rate 20, height 5\' 9"  (1.753 m), weight 55.8 kg, last menstrual period 10/23/2018, SpO2 100 %.  Examination General: Well-developed, well-nourished female in no acute distress; appearance consistent with age of record HENT: normocephalic; atraumatic Eyes: pupils equal, round and reactive to light; extraocular muscles intact Neck: supple Heart: regular rate and rhythm; no murmur Lungs: clear to auscultation bilaterally Chest: Nontender Abdomen: soft; nondistended; epigastric tenderness; no masses, hernias or hepatosplenomegaly palpated; bowel  sounds present Extremities: No deformity; full range of motion; pulses normal Neurologic: Awake, alert and oriented; motor function intact in all extremities and symmetric; no facial droop Skin: Warm and dry Psychiatric: Normal mood and affect   RESULTS  Summary of this visit's results, reviewed by myself:   EKG Interpretation  Date/Time:    Ventricular Rate:    PR Interval:    QRS Duration:   QT Interval:    QTC Calculation:   R Axis:     Text Interpretation:        Laboratory Studies: Results for orders placed or performed during  the hospital encounter of 11/02/18 (from the past 24 hour(s))  Urinalysis, Routine w reflex microscopic     Status: Abnormal   Collection Time: 11/02/18  3:16 AM  Result Value Ref Range   Color, Urine YELLOW YELLOW   APPearance CLEAR CLEAR   Specific Gravity, Urine 1.025 1.005 - 1.030   pH 6.0 5.0 - 8.0   Glucose, UA NEGATIVE NEGATIVE mg/dL   Hgb urine dipstick NEGATIVE NEGATIVE   Bilirubin Urine NEGATIVE NEGATIVE   Ketones, ur 15 (A) NEGATIVE mg/dL   Protein, ur NEGATIVE NEGATIVE mg/dL   Nitrite NEGATIVE NEGATIVE   Leukocytes,Ua NEGATIVE NEGATIVE  Pregnancy, urine     Status: None   Collection Time: 11/02/18  3:16 AM  Result Value Ref Range   Preg Test, Ur NEGATIVE NEGATIVE   Imaging Studies: Dg Abd Acute 2+v W 1v Chest  Result Date: 11/02/2018 CLINICAL DATA:  Mid abdominal pain, chest pain EXAM: DG ABDOMEN ACUTE W/ 1V CHEST COMPARISON:  None. FINDINGS: There is no evidence of dilated bowel loops or free intraperitoneal air. No radiopaque calculi or other significant radiographic abnormality is seen. Heart size and mediastinal contours are within normal limits. Both lungs are clear. IMPRESSION: Negative abdominal radiographs.  No acute cardiopulmonary disease. Electronically Signed   By: Charlett NoseKevin  Dover M.D.   On: 11/02/2018 03:43    ED COURSE and MDM  Nursing notes and initial vitals signs, including pulse oximetry, reviewed.  Vitals:   11/02/18 0245 11/02/18 0246  BP: (!) 142/105   Pulse: 83   Resp: 20   Temp: 98.5 F (36.9 C)   TempSrc: Oral   SpO2: 100%   Weight:  55.8 kg  Height:  5\' 9"  (1.753 m)   3:59 AM A review of the patient's records indicates she had a cardiology office visit with a Dr. Rosemary HolmsPatwardhan on 10/22/2018 where she complained of sharp, left-sided chest pains lasting seconds to minutes and unrelated to exertion.  She also complained of episodes of lightheadedness and palpitations which improved with lying supine.  It is noted that her EKG has historically  showed inferior T wave inversions which have not changed.  A treadmill stress test was recommended to rule out any significant cardiac abnormalities.  It was also recommended that an echocardiogram be obtained.  She is also seen a gastroenterologist who is concerned about an abdominal aneurysm.  She has an abdominal ultrasound pending.  She was encouraged to follow-up with this as well as the cardiologist's recommendations.  PROCEDURES    ED DIAGNOSES     ICD-10-CM   1. Atypical chest pain  R07.89   2. Palpitations  R00.2   3. Epigastric pain  R10.13        , Jonny RuizJohn, MD 11/02/18 [phone]

## 2018-11-02 NOTE — ED Notes (Signed)
Patient transported to Ultrasound 

## 2018-11-02 NOTE — ED Notes (Addendum)
Pt refused both medications- reports not having acid related pain and already have nexium at home. Pt did not take nexium PTA but will when she leaves.

## 2018-11-02 NOTE — ED Triage Notes (Signed)
Chest pain with abd pain for 2 weeks. Pt vague with sx. NO sob or fever. Denies vomiting.

## 2018-11-02 NOTE — ED Provider Notes (Signed)
Care assumed from Dr. Harvie Heck, please see her note for full details, but in brief Becky Mclaughlin is a 18 y.o. female who presnets with intermittent chest discomfort and epigastric abdominal pain over the past two weeks.  She was evaluated in the ED for the same overnight.  Returns today due to continued symptoms despite reassuring work-up.  Patient requesting ultrasound because she is concerned that she has an abdominal aneurysm.  She has been seen by her PCP and GI doctor as well as cardiology for the symptoms.  Patient well-appearing with normal vitals, plan is to get abdominal ultrasound, if negative patient can be discharged home.  US Abdomen Complete  Result Date: 11/02/2018 CLINICAL DATA:  Worsening paraumbilical abdominal pain for 2 weeks. EXAM: ABDOMEN ULTRASOUND COMPLETE COMPARISON:  None. FINDINGS: Gallbladder: No gallstones or wall thickening visualized. No sonographic Murphy sign noted by sonographer. Common bile duct: Diameter: 3 mm, within normal limits. Liver: No focal lesion identified. Within normal limits in parenchymal echogenicity. Portal vein is patent on color Doppler imaging with normal direction of blood flow towards the liver. IVC: No abnormality visualized. Pancreas: Visualized portion unremarkable. Spleen: Size and appearance within normal limits. Right Kidney: Length: 10.0 cm. Echogenicity within normal limits. No mass or hydronephrosis visualized. Left Kidney: Length: 10.0 cm. Echogenicity within normal limits. No mass or hydronephrosis visualized. Abdominal aorta: No aneurysm visualized. Other findings: None. IMPRESSION: Negative abdomen ultrasound. Electronically Signed   By: Marlaine Hind M.D.   On: 11/02/2018 15:45   Discussed reassuring ultrasound results with the patient will have her follow-up with her GI doctor and cardiologist as planned.  Return precautions discussed.  Patient expresses understanding and agreement with plan.  Discharged home in good condition.     Jacqlyn Larsen, PA-C 11/02/18 1614    Isla Pence, MD 11/04/18 [phone]

## 2018-11-02 NOTE — ED Triage Notes (Addendum)
Pt c/o mid abd pain with n/d x 1 week, seen here last night for same , did not f/u with GI or start taking prilosec, pt requesting [place]

## 2018-11-07 ENCOUNTER — Other Ambulatory Visit: Payer: Self-pay

## 2018-11-07 ENCOUNTER — Ambulatory Visit: Payer: BC Managed Care – PPO

## 2018-11-07 ENCOUNTER — Encounter

## 2018-11-07 ENCOUNTER — Ambulatory Visit (INDEPENDENT_AMBULATORY_CARE_PROVIDER_SITE_OTHER): Payer: BC Managed Care – PPO

## 2018-11-07 DIAGNOSIS — R079 Chest pain, unspecified: Secondary | ICD-10-CM

## 2018-11-07 DIAGNOSIS — R42 Dizziness and giddiness: Secondary | ICD-10-CM

## 2018-11-08 NOTE — Progress Notes (Signed)
LMTCB

## 2018-11-09 ENCOUNTER — Telehealth: Payer: Self-pay

## 2018-11-09 NOTE — Telephone Encounter (Signed)
Spoke with patient concerning her ECHO results. Patient verbalized understanding.

## 2018-11-09 NOTE — Telephone Encounter (Signed)
-----   Message from Carla Drape, [place] sent at 11/08/2018  8:59 AM EDT ----- [facility]

## 2018-11-26 ENCOUNTER — Encounter

## 2018-11-29 DIAGNOSIS — F410 Panic disorder [episodic paroxysmal anxiety] without agoraphobia: Secondary | ICD-10-CM | POA: Insufficient documentation

## 2019-02-13 DIAGNOSIS — F32A Depression, unspecified: Secondary | ICD-10-CM | POA: Insufficient documentation

## 2019-02-13 DIAGNOSIS — F329 Major depressive disorder, single episode, unspecified: Secondary | ICD-10-CM | POA: Insufficient documentation

## 2019-02-13 DIAGNOSIS — N906 Unspecified hypertrophy of vulva: Secondary | ICD-10-CM | POA: Insufficient documentation

## 2019-05-29 IMAGING — RF DG UGI W/ HIGH DENSITY W/O KUB
10 series · 15 of 24 positions shown · non-contrast
Comparison: None.

CLINICAL DATA: Upper abdominal pain and nausea.  Belching

EXAM:
UPPER GI SERIES WITH KUB
TECHNIQUE: After obtaining a scout radiograph a routine upper GI series was
performed using thin and high density barium.
FLUOROSCOPY TIME:  Fluoroscopy Time:  2 minutes and 24 seconds
Radiation Exposure Index (if provided by the fluoroscopic device):
52 mGy
Number of Acquired Spot Images: 5

[Series 1: one shot · 0.14mm/px · 1 of 1 slices shown (1 of 4)]
[im 1/1]
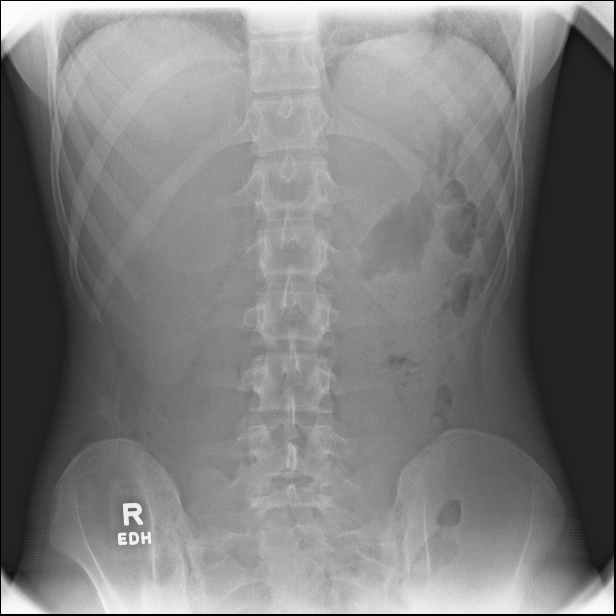

[Series 2: sequence · 1 of 12 frames shown (1 of 6)]
[frame 8/12]
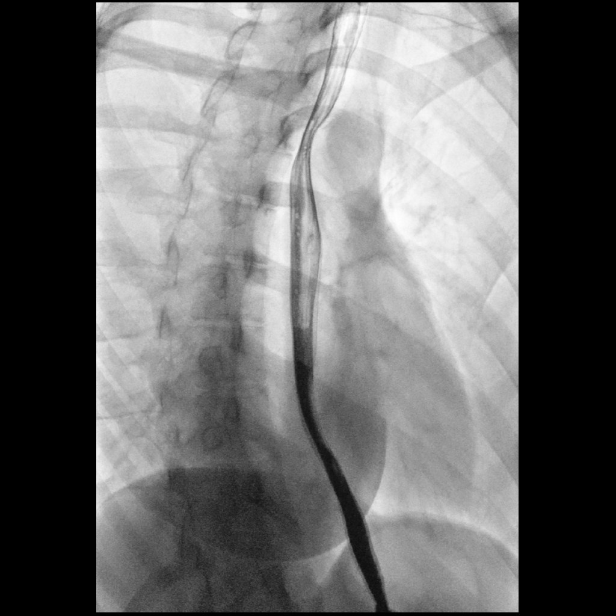

[Series 3: one shot · 0.15mm/px · 2 of 3 slices shown (2 of 4)]
[im 2/3]
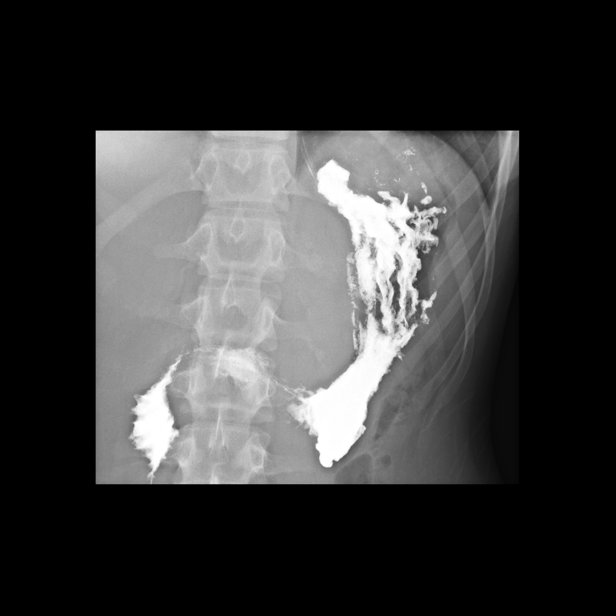
[im 3/3]
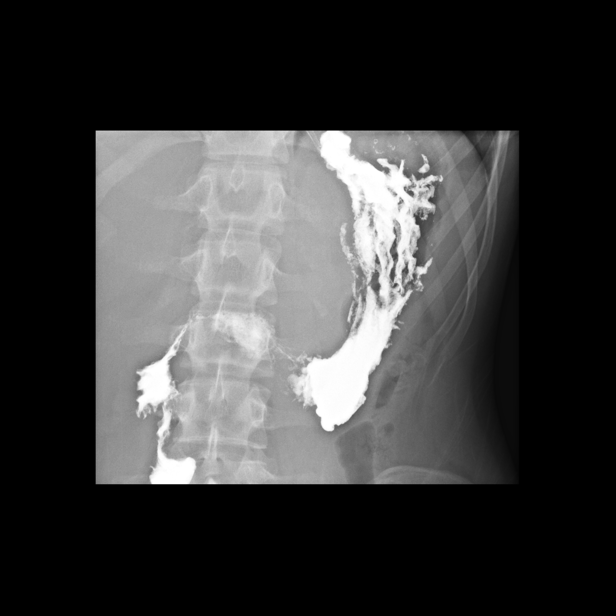

[Series 4: sequence · 2 of 13 frames shown (2 of 6)]
[frame 7/13]
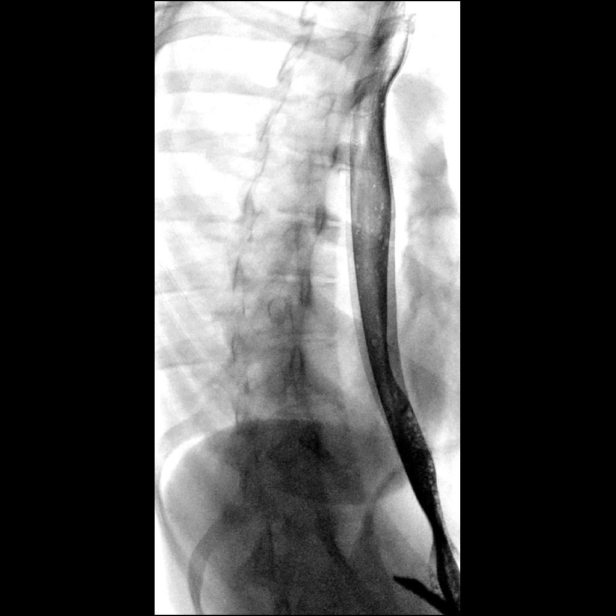
[frame 12/13]
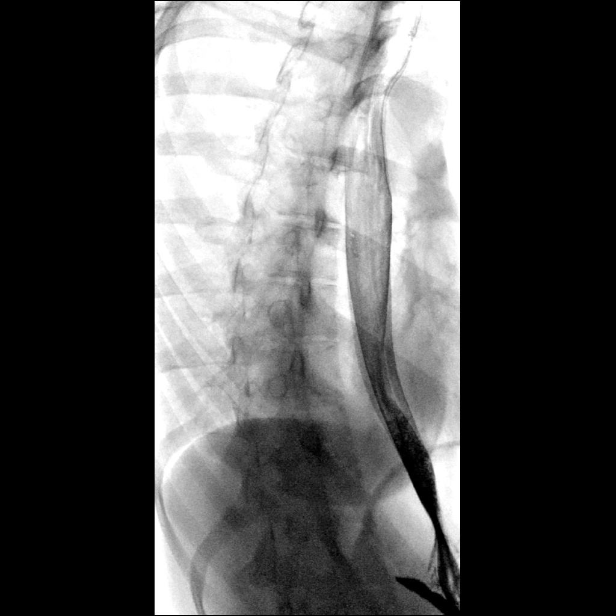

[Series 5: sequence · 1 of 11 frames shown (3 of 6)]
[frame 9/11]
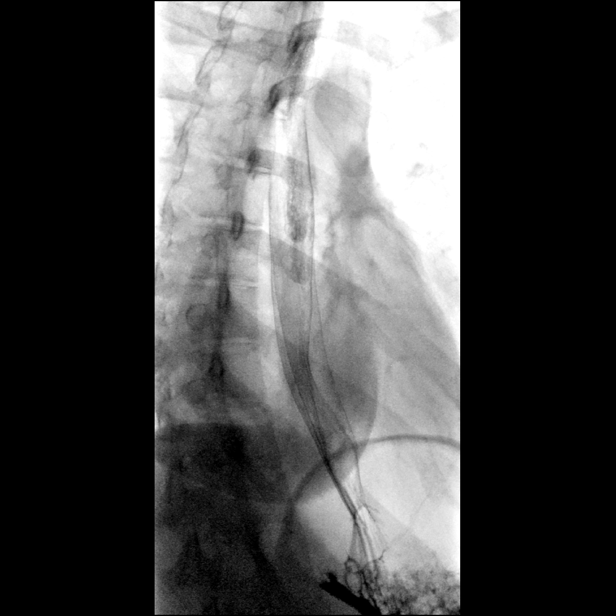

[Series 6: one shot · 0.15mm/px · 2 of 4 slices shown (3 of 4)]
[im 2/4]
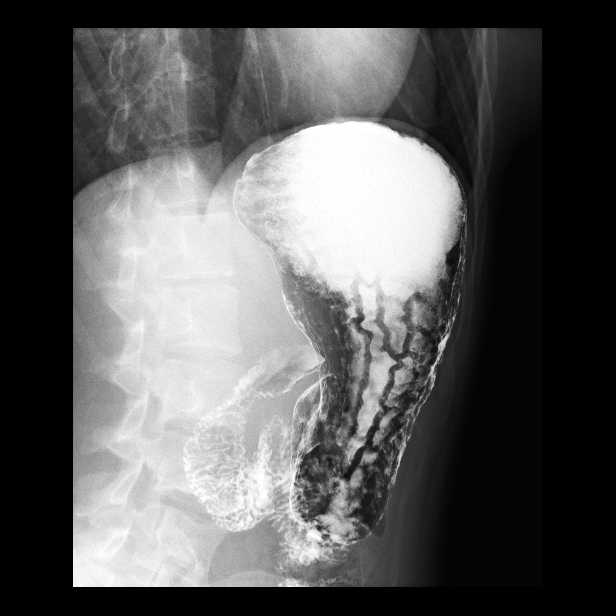
[im 3/4]
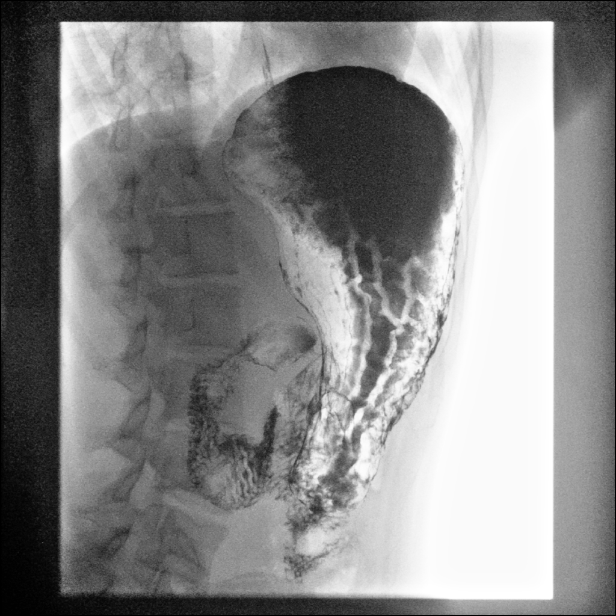

[Series 7: sequence · 2 of 11 frames shown (4 of 6)]
[frame 3/11]
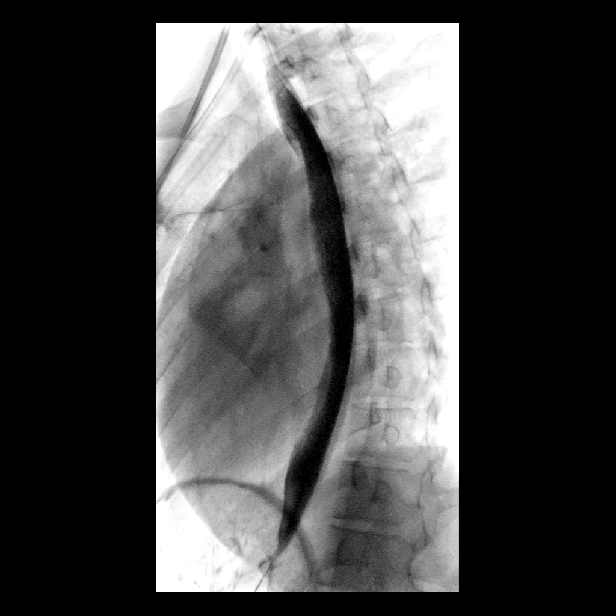
[frame 6/11]
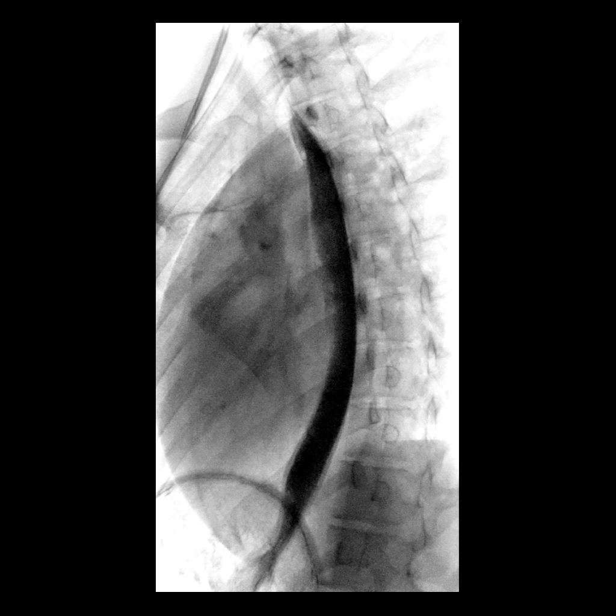

[Series 8: sequence · 1 of 18 frames shown (5 of 6)]
[frame 10/18]
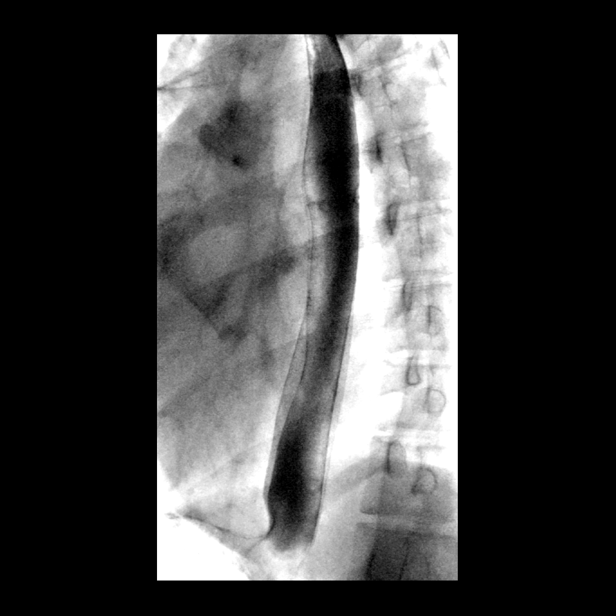

[Series 9: sequence · 2 of 8 frames shown (6 of 6)]
[frame 2/8]
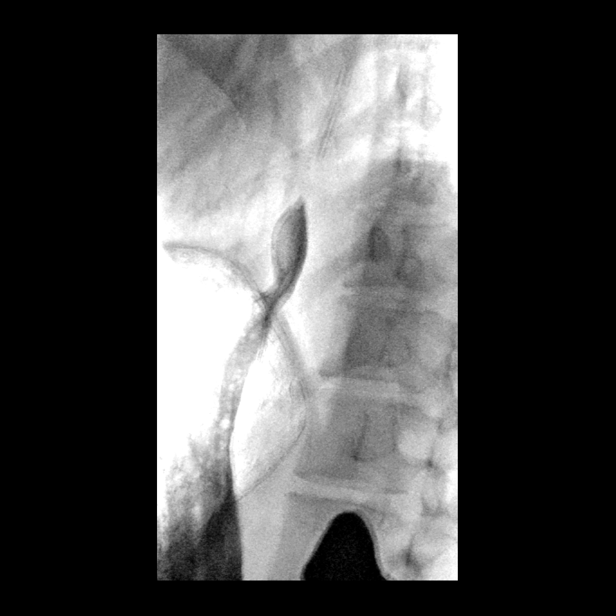
[frame 5/8]
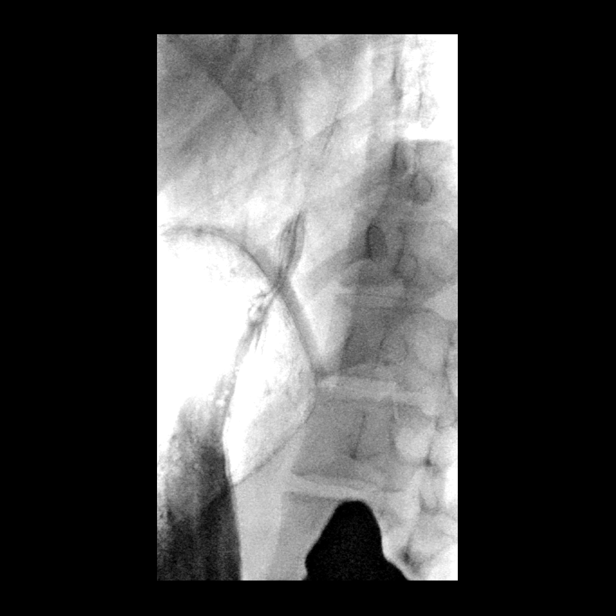

[Series 10: one shot · 1 of 1 slices shown (4 of 4)]
[im 1/1]
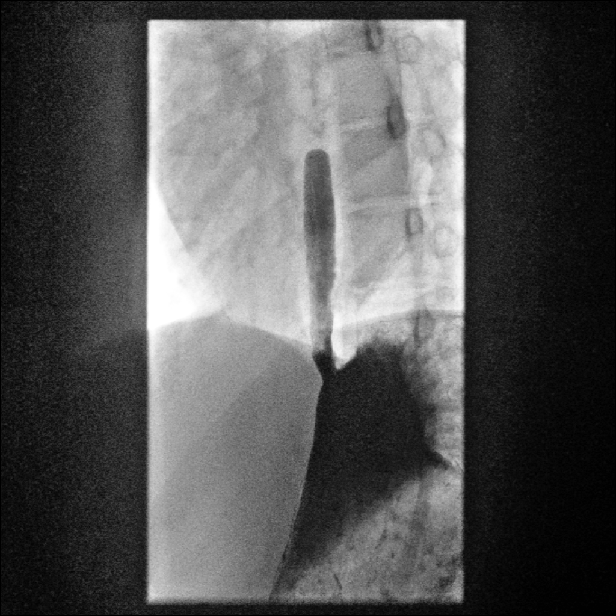

[15 of 24 positions shown; findings below may reference images not displayed]

FINDINGS: Normal esophageal motility. No intrinsic or extrinsic lesions were
identified.

Small sliding-type hiatal hernia with inducible GE reflux.

Normal appearance of the stomach, duodenum bulb and C-loop.
IMPRESSION: Small sliding-type hiatal hernia with inducible GE reflux.

Normal appearance of the stomach and duodenum.

## 2019-12-16 ENCOUNTER — Other Ambulatory Visit: Payer: Self-pay

## 2019-12-16 ENCOUNTER — Ambulatory Visit (INDEPENDENT_AMBULATORY_CARE_PROVIDER_SITE_OTHER): Payer: BC Managed Care – PPO

## 2019-12-16 DIAGNOSIS — Z3200 Encounter for pregnancy test, result unknown: Secondary | ICD-10-CM

## 2019-12-16 DIAGNOSIS — O3680X0 Pregnancy with inconclusive fetal viability, not applicable or unspecified: Secondary | ICD-10-CM | POA: Diagnosis not present

## 2019-12-16 DIAGNOSIS — Z3400 Encounter for supervision of normal first pregnancy, unspecified trimester: Secondary | ICD-10-CM | POA: Diagnosis not present

## 2019-12-16 DIAGNOSIS — O219 Vomiting of pregnancy, unspecified: Secondary | ICD-10-CM

## 2019-12-16 DIAGNOSIS — Z789 Other specified health status: Secondary | ICD-10-CM

## 2019-12-16 LAB — POCT URINE PREGNANCY: Preg Test, Ur: POSITIVE — AB

## 2019-12-16 MED ORDER — DOXYLAMINE-PYRIDOXINE 10-10 MG PO TBEC: 2.0000 | DELAYED_RELEASE_TABLET | Freq: Every day | ORAL | 5 refills | Status: DC

## 2019-12-16 MED ORDER — BLOOD PRESSURE KIT DEVI: 1.0000 | 0 refills | Status: DC | PRN

## 2019-12-16 NOTE — Progress Notes (Signed)
..    In-Person:   Location: [facility] Patient: Becky Mclaughlin Provider: Nurse   I discussed the limitations, risks, security and privacy concerns of performing an evaluation and management service by telephone and the availability of in person appointments. I also discussed with the patient that there may be a patient responsible charge related to this service. The patient expressed understanding and agreed to proceed.   History of Present Illness: PRENATAL INTAKE SUMMARY  Becky Mclaughlin presents today New OB Nurse Interview.  OB History    Gravida  1   Para      Term      Preterm      AB      Living  0     SAB      TAB      Ectopic      Multiple      Live Births             I have reviewed the patient's medical, obstetrical, social, and family histories, medications, and available lab results.  SUBJECTIVE She has no unusual complaints   Observations/Objective: Initial nurse interview for history/labs (New OB)  EDD: 07-12-20 GA: [redacted]w[redacted]d GP: G1P0   GENERAL APPEARANCE: alert, well appearing  Assessment and Plan: Normal pregnancy Prenatal care: No previous prenatal care Pt denies hx of any pre-existing conditions Pt & FOB report family history of stillbirths Blood pressure cuff sent to [facility] Babyscripts ordered U/S performed today    Follow Up Instructions:   I discussed the assessment and treatment plan with the patient. The patient was provided an opportunity to ask questions and all were answered. The patient agreed with the plan and demonstrated an understanding of the instructions.   The patient was advised to call back or seek an in-person evaluation if the symptoms worsen or if the condition fails to improve as anticipated.  I provided 15 minutes of non-face-to-face time during this encounter.   Katrina Stack, RN

## 2019-12-16 NOTE — Progress Notes (Signed)
U/S performed today reveals single live IUP at [redacted]w[redacted]d pregnancy by CRL. FHR 126.  Diclegis sent to the pharmacy for nausea and vomiting.  Pain and bleeding precautions reviewed with patient and when to seek medical attention.  Patient verbalized understanding.  Had patient discuss taking ADHD/Anxiety medications with provider.

## 2019-12-19 NOTE — Progress Notes (Signed)
I have reviewed this chart and agree with the RN/CMA assessment and management.    K. Meryl Safia Panzer, M.D. Attending [facility] (Faculty Practice)   

## 2019-12-27 ENCOUNTER — Telehealth: Payer: Self-pay

## 2019-12-27 NOTE — Telephone Encounter (Signed)
Return to call to early OB pt regarding pregancy restriction letter. Pt currently 7wks States her job has put her on leave due to letter pt requested /ask I we could change restriction Pt made aware the OB letter she was given is our standard letter She has not been seen by MD yet for NOB.  Pt advised to discuss work restrictions/accomodations  etc with the provider at her visit and that I cannot make changes to the letter.  Pt voiced understanding.

## 2020-01-13 ENCOUNTER — Ambulatory Visit (INDEPENDENT_AMBULATORY_CARE_PROVIDER_SITE_OTHER): Payer: PRIVATE HEALTH INSURANCE | Admitting: Advanced Practice Midwife

## 2020-01-13 ENCOUNTER — Encounter: Payer: Self-pay | Admitting: Obstetrics

## 2020-01-13 ENCOUNTER — Other Ambulatory Visit: Payer: Self-pay

## 2020-01-13 ENCOUNTER — Other Ambulatory Visit (HOSPITAL_COMMUNITY)
Admission: RE | Admit: 2020-01-13 | Discharge: 2020-01-13 | Disposition: A | Discharge status: Home / Self Care | Payer: BC Managed Care – PPO | Source: Ambulatory Visit | Attending: Advanced Practice Midwife | Admitting: Advanced Practice Midwife

## 2020-01-13 ENCOUNTER — Ambulatory Visit (INDEPENDENT_AMBULATORY_CARE_PROVIDER_SITE_OTHER): Payer: PRIVATE HEALTH INSURANCE

## 2020-01-13 VITALS — BP 117/73 | HR 80 | Wt 152.0 lb

## 2020-01-13 DIAGNOSIS — Z3400 Encounter for supervision of normal first pregnancy, unspecified trimester: Secondary | ICD-10-CM

## 2020-01-13 DIAGNOSIS — O368390 Maternal care for abnormalities of the fetal heart rate or rhythm, unspecified trimester, not applicable or unspecified: Secondary | ICD-10-CM

## 2020-01-13 DIAGNOSIS — Z3A10 10 weeks gestation of pregnancy: Secondary | ICD-10-CM | POA: Diagnosis not present

## 2020-01-13 NOTE — Progress Notes (Signed)
Subjective:   Becky Mclaughlin is a 19 y.o. G1P0 at 31w1dby early ultrasound being seen today for her first obstetrical visit.  Her obstetrical history is significant for n/a G1P0. and has Chest pain; Lightheadedness; and Supervision of normal first pregnancy, antepartum on their problem list.. Patient does intend to breast feed. Pregnancy history fully reviewed.  Patient reports no complaints.  HISTORY: OB History  Gravida Para Term Preterm AB Living  1 0 0 0 0 0  SAB TAB Ectopic Multiple Live Births  0 0 0 0 0    # Outcome Date GA Lbr Len/2nd Weight Sex Delivery Anes PTL Lv  1 Current            Past Medical History:  Diagnosis Date  . Acid reflux    No past surgical history on file. Family History  Problem Relation Age of Onset  . Hypertension Mother        "had hole in her heart"  . Hypertension Father   . Stroke Cousin        At age 10126, used to vape   Social History   Tobacco Use  . Smoking status: Never Smoker  . Smokeless tobacco: Never Used  Substance Use Topics  . Alcohol use: Not Currently    Comment: occasional  . Drug use: Never   Allergies  Allergen Reactions  . Fluconazole Other (See Comments)    Syncopal episode and , hot flashes and feeling lightheaded, jittery   Current Outpatient Medications on File Prior to Visit  Medication Sig Dispense Refill  . Blood Pressure Monitoring (BLOOD PRESSURE KIT) DEVI 1 kit by Does not apply route as needed. 1 each 0  . Doxylamine-Pyridoxine (DICLEGIS) 10-10 MG TBEC Take 2 tablets by mouth at bedtime. If symptoms persist, add one tablet in the morning and one in the afternoon 100 tablet 5  . famotidine (PEPCID) 10 MG tablet Take 10 mg by mouth 2 (two) times daily. (Patient not taking: Reported on 12/16/2019)    . omeprazole (PRILOSEC) 40 MG capsule  (Patient not taking: Reported on 12/16/2019)    . Prenatal Vit-Fe Fumarate-FA (MULTIVITAMIN-PRENATAL) 27-0.8 MG TABS tablet Take 1 tablet by mouth daily at 12 noon.      No current facility-administered medications on file prior to visit.    Indications for ASA therapy (per uptodate) One of the following: Previous pregnancy with preeclampsia, especially early onset and with an adverse outcome No Multifetal gestation No Chronic hypertension No Type 1 or 2 diabetes mellitus No Chronic kidney disease No Autoimmune disease (antiphospholipid syndrome, systemic lupus erythematosus) No  Two or more of the following: Nulliparity Yes Obesity (body mass index >30 kg/m2) No Family history of preeclampsia in mother or sister No Age ?35 years No Sociodemographic characteristics (African American race, low socioeconomic level) No Personal risk factors (eg, previous pregnancy with low birth weight or small for gestational age infant, previous adverse pregnancy outcome [eg, stillbirth], interval >10 years between pregnancies) No  Indications for early 1 hour GTT (per uptodate)  BMI >25 (>23 in Asian women); no   Exam   Vitals:   01/13/20 0942  BP: 117/73  Pulse: 80  Weight: 68.9 kg      Uterus:     Pelvic Exam: Perineum: no hemorrhoids, normal perineum   Vulva: normal external genitalia, no lesions   Vagina:  normal mucosa, normal discharge   Cervix: no lesions and normal, pap smear done.    Adnexa: normal adnexa and  no mass, fullness, tenderness   Bony Pelvis: average  System: General: well-developed, well-nourished female in no acute distress   Breast:  normal appearance, no masses or tenderness   Skin: normal coloration and turgor, no rashes   Neurologic: oriented, normal, negative, normal mood   Extremities: normal strength, tone, and muscle mass, ROM of all joints is normal   HEENT PERRLA, extraocular movement intact and sclera clear, anicteric   Mouth/Teeth mucous membranes moist, pharynx normal without lesions and dental hygiene good   Neck supple and no masses   Cardiovascular: regular rate and rhythm   Respiratory:  no respiratory  distress, normal breath sounds   Abdomen: soft, non-tender; bowel sounds normal; no masses,  no organomegaly     Assessment:   Pregnancy: G1P0 Patient Active Problem List   Diagnosis Date Noted  . Supervision of normal first pregnancy, antepartum 12/16/2019  . Chest pain 10/22/2018  . Lightheadedness 10/22/2018     Plan:  1. Supervision of normal first pregnancy, antepartum  - Cervicovaginal ancillary only( [place]) - Culture, OB Urine - CBC/D/Plt+RPR+Rh+ABO+Rub Ab... - Genetic Screening - US MFM OB COMP + 14 WK; Future  2. Unable to hear fetal heart tones as reason for ultrasound scan  - US OB Limited; Future - FHR normal -size equals previous date    Initial labs drawn. Continue prenatal vitamins. Discussed and offered genetic screening options, including Quad screen/AFP, NIPS testing, and option to decline testing. Benefits/risks/alternatives reviewed. Pt aware that anatomy US is form of genetic screening with lower accuracy in detecting trisomies than blood work.  Pt chooses genetic screening today. NIPS: requested. Ultrasound discussed; fetal anatomic survey: requested. Problem list reviewed and updated. The nature of [place] with multiple MDs and other Advanced Practice Providers was explained to patient; also emphasized that residents, students are part of our team. Routine obstetric precautions reviewed. Return in about 4 weeks (around 02/10/2020).   Valli Glance, Student-MidWife 01/13/20 11:05 AM   CNM attestation:  I have seen and examined this patient; I agree with above documentation in the midwife student's note.   Becky Mclaughlin is a 19 y.o. G1P0 in the [facility] office for initial prenatal visit for low risk pregnancy. See problem list below. +FM, denies LOF, VB, contractions, vaginal discharge.  Patient Active Problem List   Diagnosis Date Noted  . Supervision of normal first pregnancy, antepartum  12/16/2019  . Chest pain 10/22/2018  . Lightheadedness 10/22/2018     ROS, labs, PMH reviewed  PE: BP 117/73   Pulse 80   Wt 152 lb (68.9 kg)   LMP 10/06/2019   BMI 22.45 kg/m  Gen: calm comfortable, well appearing Resp: normal effort, no distress Abd: gravid appropriate for gestational age  Fundal height: n/a, Bedside US today wnl, CRL c/w previous dates FHT by doppler: not heard by doppler  Plan: --Return in 4 weeks  1. Supervision of normal first pregnancy, antepartum  - Culture, OB Urine - CBC/D/Plt+RPR+Rh+ABO+Rub Ab... - Genetic Screening - US MFM OB COMP + 14 WK; Future - Cervicovaginal ancillary only( [place])  2. Unable to hear fetal heart tones as reason for ultrasound scan --US wnl - US OB Limited; Future    Fatima Blank, CNM 12:03 PM

## 2020-01-13 NOTE — Progress Notes (Signed)
New OB, reports no problems today  

## 2020-01-14 LAB — CERVICOVAGINAL ANCILLARY ONLY
Chlamydia: NEGATIVE
Comment: NEGATIVE
Comment: NEGATIVE
Comment: NORMAL
Neisseria Gonorrhea: NEGATIVE
Trichomonas: NEGATIVE

## 2020-01-14 LAB — CBC/D/PLT+RPR+RH+ABO+RUB AB...
Antibody Screen: NEGATIVE
Basophils Absolute: 0 10*3/uL (ref 0.0–0.2)
Basos: 0 %
EOS (ABSOLUTE): 0.1 10*3/uL (ref 0.0–0.4)
Eos: 1 %
HCV Ab: <0.1 s/co ratio (ref 0.0–0.9)
HIV Screen 4th Generation wRfx: NONREACTIVE
Hematocrit: 44.5 % (ref 34.0–46.6)
Hemoglobin: 15 g/dL (ref 11.1–15.9)
Hepatitis B Surface Ag: NEGATIVE
Immature Grans (Abs): 0 10*3/uL (ref 0.0–0.1)
Immature Granulocytes: 0 %
Lymphocytes Absolute: 1.9 10*3/uL (ref 0.7–3.1)
Lymphs: 19 %
MCH: 31.1 pg (ref 26.6–33.0)
MCHC: 33.7 g/dL (ref 31.5–35.7)
MCV: 92 fL (ref 79–97)
Monocytes Absolute: 0.9 10*3/uL (ref 0.1–0.9)
Monocytes: 9 %
Neutrophils Absolute: 7.2 10*3/uL — ABNORMAL HIGH (ref 1.4–7.0)
Neutrophils: 71 %
Platelets: 323 10*3/uL (ref 150–450)
RBC: 4.83 x10E6/uL (ref 3.77–5.28)
RDW: 12.4 % (ref 11.7–15.4)
RPR Ser Ql: NONREACTIVE
Rh Factor: NEGATIVE
Rubella Antibodies, IGG: 8.64 index (ref >0.99)
WBC: 10.1 10*3/uL (ref 3.4–10.8)

## 2020-01-14 LAB — HCV INTERPRETATION

## 2020-01-15 LAB — CULTURE, OB URINE

## 2020-01-15 LAB — URINE CULTURE, OB REFLEX

## 2020-01-20 ENCOUNTER — Encounter: Payer: Self-pay | Admitting: Advanced Practice Midwife

## 2020-01-28 ENCOUNTER — Encounter: Payer: Self-pay | Admitting: Advanced Practice Midwife

## 2020-02-07 ENCOUNTER — Ambulatory Visit (INDEPENDENT_AMBULATORY_CARE_PROVIDER_SITE_OTHER): Payer: BC Managed Care – PPO | Admitting: Obstetrics and Gynecology

## 2020-02-07 ENCOUNTER — Other Ambulatory Visit: Payer: Self-pay

## 2020-02-07 VITALS — BP 111/70 | HR 89 | Wt 150.0 lb

## 2020-02-07 DIAGNOSIS — M5432 Sciatica, left side: Secondary | ICD-10-CM

## 2020-02-07 DIAGNOSIS — O26899 Other specified pregnancy related conditions, unspecified trimester: Secondary | ICD-10-CM

## 2020-02-07 DIAGNOSIS — Z6791 Unspecified blood type, Rh negative: Secondary | ICD-10-CM

## 2020-02-07 DIAGNOSIS — Z3400 Encounter for supervision of normal first pregnancy, unspecified trimester: Secondary | ICD-10-CM

## 2020-02-07 DIAGNOSIS — Z23 Encounter for immunization: Secondary | ICD-10-CM | POA: Diagnosis not present

## 2020-02-07 NOTE — Patient Instructions (Signed)
Rh Incompatibility Rh incompatibility is a condition that occurs during pregnancy if a woman has Rh-negative blood and her baby has Rh-positive blood. "Rh-negative" and "Rh-positive" refer to whether or not the blood has a specific protein found on the surface of red blood cells (Rh factor). If a woman has Rh factor, she is Rh-positive. If she does not have an Rh factor, she is Rh-negative. Having or not having an Rh factor does not affect the mother's general health. However, it can cause problems during pregnancy. What kind of problems can Rh incompatibility cause? During pregnancy, blood from the baby can cross into the mother's bloodstream, especially during delivery. If a mother is Rh-negative and the baby is Rh-positive, the mother's defense (immune) system will react to the baby's blood as if it were a foreign substance and will create certain proteins (antibodies) in response to it. This process is called sensitization. Once the mother is sensitized, her Rh antibodies will cross the placenta in future pregnancies and attack the baby's Rh-positive blood as if it were a harmful substance. Rh incompatibility can also happen if a pregnant woman who is Rh-negative receives a donation (transfusion) of Rh-positive blood. How does this condition affect my baby? The Rh antibodies that attack and destroy your baby's red blood cells can lead to hemolytic disease in the baby. This is a condition in which red blood cells break down. This can cause:  Yellowing of the skin and the whites of the eyes (jaundice).  Fewer red blood cells in the body (anemia).  Brain damage.  Heart failure.  Death. These antibodies usually do not cause problems during a woman's first pregnancy. This is because blood from the baby often crosses into the mother's bloodstream during delivery, and the baby is born before many of the antibodies can develop. However, once antibodies have formed, they stay in a woman's body. Because  of this, Rh incompatibility is more likely to cause problems in second or later pregnancies (if the baby is Rh-positive). How is this diagnosed?  When you become pregnant, you may have blood tests to determine your blood type and Rh factor. If you are Rh-negative, you may have another blood test called an antibody screen. The antibody screen shows whether you have Rh antibodies in your blood. If you do, it may mean that you have been exposed to Rh-positive blood before, and that you have a risk for Rh incompatibility. To find out whether your baby is developing hemolytic anemia and how serious it is, health care providers may use more advanced tests, such as ultrasound. How is Rh incompatibility treated? This condition is treated with two shots (injections) of medicine called Rho (D) immune globulin. This medicine keeps your body from making antibodies that can cause serious problems for your baby or for future babies. You will get one shot around your seventh month of pregnancy and the other within 72 hours of your baby's birth. If you are Rh-negative, you will need this medicine every time you have a baby with Rh-positive blood. If you are Rh-negative and there is a high risk of blood transfer between you and your baby, you may be given Rho (D) immune globulin. The risk of blood transfer is high if you experience:  Amniocentesis. This is a procedure to remove a small amount of the fluid that surrounds a baby in the uterus (amniotic fluid) so that it can be tested.  A miscarriage or an abortion.  An ectopic pregnancy. This is a pregnancy in   which the fertilized egg attaches (implants) outside the uterus.  Any vaginal bleeding during pregnancy. If you already have antibodies in your blood, your pregnancy will be closely monitored. You will not be given Rho (D) immune globulin because it is not effective in these cases. Summary  Rh incompatibility is a condition that occurs during pregnancy if a  woman has Rh-negative blood and her baby has Rh-positive blood.  If a mother is Rh-negative and the baby is Rh-positive, the mother's immune system will react to the baby's blood as if it were a foreign substance, and will create antibodies.  Rh antibodies that attack and destroy the baby's red blood cells can lead to hemolytic disease in the baby.  This condition is treated with a shot of medicine called Rho (D) immune globulin. This medicine keeps the woman's body from making antibodies that can cause serious problems in the baby or future babies. This information is not intended to replace advice given to you by your health care provider. Make sure you discuss any questions you have with your health care provider. Document Revised: 01/20/2017 Document Reviewed: 04/05/2016 Elsevier Patient Education  2020 Elsevier Inc.  

## 2020-02-07 NOTE — Progress Notes (Signed)
ROB   CC: back pain and hip pain

## 2020-02-07 NOTE — Progress Notes (Signed)
   PRENATAL VISIT NOTE  Subjective:  Becky Mclaughlin is a 19 y.o. G1P0 at [redacted]w[redacted]d being seen today for ongoing prenatal care.  She is currently monitored for the following issues for this low-risk pregnancy and has Chest pain; Lightheadedness; and Supervision of normal first pregnancy, antepartum on their problem list.  Patient reports no complaints.  Contractions: Not present. Vag. Bleeding: None.  Movement: Absent. Denies leaking of fluid.   The following portions of the patient's history were reviewed and updated as appropriate: allergies, current medications, past family history, past medical history, past social history, past surgical history and problem list.   Objective:   Vitals:   02/07/20 1057  BP: 111/70  Pulse: 89  Weight: 150 lb (68 kg)    Fetal Status: Fetal Heart Rate (bpm): 140   Movement: Absent     General:  Alert, oriented and cooperative. Patient is in no acute distress.  Skin: Skin is warm and dry. No rash noted.   Cardiovascular: Normal heart rate noted  Respiratory: Normal respiratory effort, no problems with respiration noted  Abdomen: Soft, gravid, appropriate for gestational age.  Pain/Pressure: Absent     Pelvic: Cervical exam deferred        Extremities: Normal range of motion.  Edema: None  Mental Status: Normal mood and affect. Normal behavior. Normal judgment and thought content.   Assessment and Plan:  Pregnancy: G1P0 at [redacted]w[redacted]d  1. Supervision of normal first pregnancy, antepartum  Discussed genetic/NIPS results with patient. Per Avelina Laine: Fetal fraction is sufficient   OB MFM [place] ordered 03/17/19 Discussed AFP for next visit.  Discussed RH negative    2. Flu vaccine need  - Flu Vaccine QUAD 36+ mos IM   Preterm labor symptoms and general obstetric precautions including but not limited to vaginal bleeding, contractions, leaking of fluid and fetal movement were reviewed in detail with the patient. Please refer to After Visit Summary for other  counseling recommendations.   No follow-ups on file.  Future Appointments  Date Time Provider Department Center  03/06/2020 10:30 AM Currie Paris, NP [facility] None  03/16/2020 10:45 AM [facility]    Venia Carbon, NP

## 2020-02-08 DIAGNOSIS — M5432 Sciatica, left side: Secondary | ICD-10-CM | POA: Insufficient documentation

## 2020-02-08 DIAGNOSIS — Z6791 Unspecified blood type, Rh negative: Secondary | ICD-10-CM | POA: Insufficient documentation

## 2020-02-08 DIAGNOSIS — O26899 Other specified pregnancy related conditions, unspecified trimester: Secondary | ICD-10-CM | POA: Insufficient documentation

## 2020-02-19 ENCOUNTER — Encounter: Payer: Self-pay | Admitting: Obstetrics

## 2020-03-05 ENCOUNTER — Other Ambulatory Visit: Payer: Self-pay | Admitting: Nurse Practitioner

## 2020-03-06 ENCOUNTER — Ambulatory Visit (INDEPENDENT_AMBULATORY_CARE_PROVIDER_SITE_OTHER): Payer: PRIVATE HEALTH INSURANCE | Admitting: Nurse Practitioner

## 2020-03-06 ENCOUNTER — Other Ambulatory Visit: Payer: Self-pay

## 2020-03-06 ENCOUNTER — Encounter: Payer: Self-pay | Admitting: Nurse Practitioner

## 2020-03-06 VITALS — BP 114/77 | HR 85 | Wt 151.0 lb

## 2020-03-06 DIAGNOSIS — F329 Major depressive disorder, single episode, unspecified: Secondary | ICD-10-CM

## 2020-03-06 DIAGNOSIS — Z3400 Encounter for supervision of normal first pregnancy, unspecified trimester: Secondary | ICD-10-CM

## 2020-03-06 DIAGNOSIS — O99342 Other mental disorders complicating pregnancy, second trimester: Secondary | ICD-10-CM

## 2020-03-06 DIAGNOSIS — O99612 Diseases of the digestive system complicating pregnancy, second trimester: Secondary | ICD-10-CM

## 2020-03-06 DIAGNOSIS — F411 Generalized anxiety disorder: Secondary | ICD-10-CM

## 2020-03-06 DIAGNOSIS — Z7189 Other specified counseling: Secondary | ICD-10-CM | POA: Diagnosis not present

## 2020-03-06 DIAGNOSIS — M5430 Sciatica, unspecified side: Secondary | ICD-10-CM

## 2020-03-06 DIAGNOSIS — Z3A17 17 weeks gestation of pregnancy: Secondary | ICD-10-CM

## 2020-03-06 NOTE — Patient Instructions (Addendum)
Look at Classes on [email or web address]  Childbirth Education Options: [facility] Department Classes:  Childbirth education classes can help you get ready for a positive parenting experience. You can also meet other expectant parents and get free stuff for your baby. Each class runs for five weeks on the same night and costs $45 for the mother-to-be and her support person. Medicaid covers the cost if you are eligible. Call [phone] to register. [facility] Childbirth Education:  [phone] or [phone] or [email or web address]  Baby & Me Class: Discuss newborn & infant parenting and family adjustment issues with other new mothers in a relaxed environment. Each week brings a new speaker or baby-centered activity. We encourage new mothers to join [place] every Thursday at 11:00am. Babies birth until crawling. No registration or fee. Daddy [employer]: This course offers Dads-to-be the tools and knowledge needed to feel confident on their journey to becoming new fathers. Experienced dads, who have been trained as coaches, teach dads-to-be how to hold, comfort, diaper, swaddle and play with their infant while being able to support the new mom as well. A class for men taught by men. $25/dad Big Brother/Big Sister: Let your children share in the joy of a new brother or sister in this special class designed just for them. Class includes discussion about how families care for babies: swaddling, holding, diapering, safety as well as how they can be helpful in their new role. This class is designed for children ages 2 to 24, but any age is welcome. Please register each child individually. $5/child  Mom Talk: This mom-led group offers support and connection to mothers as they journey through the adjustments and struggles of that sometimes overwhelming first year after the birth of a child. Tuesdays at 10:00am and Thursdays at 6:00pm. Babies welcome. No registration or fee. Breastfeeding Support  Group: This group is a mother-to-mother support circle where moms have the opportunity to share their breastfeeding experiences. A Lactation Consultant is present for questions and concerns. Meets each Tuesday at 11:00am. No fee or registration. Breastfeeding Your Baby: Learn what to expect in the first days of breastfeeding your newborn.  This class will help you feel more confident with the skills needed to begin your breastfeeding experience. Many new mothers are concerned about breastfeeding after leaving the hospital. This class will also address the most common fears and challenges about breastfeeding during the first few weeks, months and beyond. (call for fee) Comfort Techniques and Tour: This 2 hour interactive class will provide you the opportunity to learn & practice hands-on techniques that can help relieve some of the discomfort of labor and encourage your baby to rotate toward the best position for birth. You and your partner will be able to try a variety of labor positions with birth balls and rebozos as well as practice breathing, relaxation, and visualization techniques. A tour of the [facility] is included with this class. $20 per registrant and support person Childbirth Class- Weekend Option: This class is a Weekend version of our Birth & Baby series. It is designed for parents who have a difficult time fitting several weeks of classes into their schedule. It covers the care of your newborn and the basics of labor and childbirth. It also includes a Maternity Care Center Tour of [facility] and lunch. The class is held two consecutive days: beginning on Friday evening from 6:30 - 8:30 p.m. and the next day, Saturday from 9 a.m. - 4 p.m. (call for fee) [place] Dolin Class:  Interested in a waterbirth?  This informational class will help you discover whether waterbirth is the right fit for you. Education about waterbirth itself, supplies you would need and how to  assemble your support team is what you can expect from this class. Some obstetrical practices require this class in order to pursue a waterbirth. (Not all obstetrical practices offer waterbirth-check with your healthcare provider.) Register only the expectant mom, but you are encouraged to bring your partner to class! Required if planning waterbirth, no fee. Infant/Child CPR: Parents, grandparents, babysitters, and friends learn Cardio-Pulmonary Resuscitation skills for infants and children. You will also learn how to treat both conscious and unconscious choking in infants and children. This Family & Friends program does not offer certification. Register each participant individually to ensure that enough mannequins are available. (Call for fee) Grandparent Love: Expecting a grandbaby? This class is for you! Learn about the latest infant care and safety recommendations and ways to support your own child as he or she transitions into the parenting role. Taught by Registered Nurses who are childbirth instructors, but most importantly...they are grandmothers too! $10/person. Childbirth Class- Natural Childbirth: This series of 5 weekly classes is for expectant parents who want to learn and practice natural methods of coping with the process of labor and childbirth. Relaxation, breathing, massage, visualization, role of the partner, and helpful positioning are highlighted. Participants learn how to be confident in their body's ability to give birth. This class will empower and help parents make informed decisions about their own care. Includes discussion that will help new parents transition into the immediate postpartum period. [facility] is included. We suggest taking this class between 25-32 weeks, but it's only a recommendation. $75 per registrant and one support person or $30 Medicaid. Childbirth Class- 3 week Series: This option of 3 weekly classes helps you and your labor  partner prepare for childbirth. Newborn care, labor & birth, cesarean birth, pain management, and comfort techniques are discussed and a [place] [facility] is included. The class meets at the same time, on the same day of the week for 3 consecutive weeks beginning with the starting date you choose. $60 for registrant and one support person.  Marvelous Multiples: Expecting twins, triplets, or more? This class covers the differences in labor, birth, parenting, and breastfeeding issues that face multiples' parents. NICU tour is included. Led by a Certified Childbirth Educator who is the mother of twins. No fee. Caring for Baby: This class is for expectant and adoptive parents who want to learn and practice the most up-to-date newborn care for their babies. Focus is on birth through the first six weeks of life. Topics include feeding, bathing, diapering, crying, umbilical cord care, circumcision care and safe sleep. Parents learn to recognize symptoms of illness and when to call the pediatrician. Register only the mom-to-be and your partner or support person can plan to come with you! $10 per registrant and support person Childbirth Class- online option: This online class offers you the freedom to complete a Birth and Baby series in the comfort of your own home. The flexibility of this option allows you to review sections at your own pace, at times convenient to you and your support people. It includes additional video information, animations, quizzes, and extended activities. Get organized with helpful eClass tools, checklists, and trackers. Once you register online for the class, you will receive an email within a few days to accept the invitation and  begin the class when the time is right for you. The content will be available to you for 60 days. $60 for 60 days of online access for you and your support people.      Check out Covid vaccine info here:    [email or web address]  7 Things You Should Know About the COVID-19 Vaccines  Here are seven facts you should know before taking your shot:  1.  No serious side effects were reported in clinical trials. Temporary reactions after receiving the vaccine may include a sore arm, headache, feeling tired and achy for a day or two or, in some cases, fever. In most cases, these reactions are good signs that your body is building protection.   2.  Scientists had a head start. They are built on decades of research on vaccines for similar viruses. A big investment of resources and focus made sure they were created without skipping any steps in development, testing, or clinical trials.  3. You cannot get COVID-19 from the vaccine. The vaccine gives your body instructions to make a protein that safely teaches you to make germ-fighting antibodies to fight the real COVID-19.  4.The vaccine protects against the Delta variant. The Delta variant, which is now predominant in [place], is much more contagious than the original virus. Vaccines continue to be remarkably effective in reducing risk of severe disease, hospitalization, and death, even against the Delta variant.  5. A hundred million people in the [place] have already received their COVID-19 vaccine.  6. It works. And once you're fully vaccinated you're protected. The vaccines are proven to help prevent COVID-19 and are effective in preventing hospitalization and death.   7. The vaccine does not affect fertility. Vaccination for those who are pregnant or wanting to become pregnant is recommended by the [employer] of Obstetricians and Gynecologists (ACOG), the Society for Maternal-Fetal Medicine (SMFM), the American Society for Reproductive Medicine (ASRM), and the Society for Female Reproduction and Urology.

## 2020-03-06 NOTE — Progress Notes (Signed)
ROB/AFP, c/o abdominal cramps.

## 2020-03-06 NOTE — Progress Notes (Signed)
    Subjective:  Becky Mclaughlin is a 20 y.o. G1P0 at [redacted]w[redacted]d being seen today for ongoing prenatal care.  She is currently monitored for the following issues for this low-risk pregnancy and has Chest pain; Lightheadedness; Supervision of normal first pregnancy, antepartum; Rh negative, antepartum; Sciatic nerve pain, left; Depressive disorder; Gastroesophageal reflux disease without esophagitis; Generalized anxiety disorder with panic attacks; Hypertrophy of vulva; Henoch-Schonlein purpura (HCC); and Headache on their problem list.  Patient reports still has some nausea but is better if she is taking Diclegis.  Contractions: Not present. Vag. Bleeding: None.   . Denies leaking of fluid.   The following portions of the patient's history were reviewed and updated as appropriate: allergies, current medications, past family history, past medical history, past social history, past surgical history and problem list. Problem list updated.  Objective:   Vitals:   03/06/20 1041  BP: 114/77  Pulse: 85  Weight: 151 lb (68.5 kg)    Fetal Status: Fetal Heart Rate (bpm): 148         General:  Alert, oriented and cooperative. Patient is in no acute distress.  Skin: Skin is warm and dry. No rash noted.   Cardiovascular: Normal heart rate noted  Respiratory: Normal respiratory effort, no problems with respiration noted  Abdomen: Soft, gravid, appropriate for gestational age. Pain/Pressure: Present     Pelvic:  Cervical exam deferred        Extremities: Normal range of motion.  Edema: None  Mental Status: Normal mood and affect. Normal behavior. Normal judgment and thought content.   Urinalysis:      Assessment and Plan:  Pregnancy: G1P0 at [redacted]w[redacted]d  1. Supervision of normal first pregnancy, antepartum Has anatomy US scheduled Reviewed high BP in pregnancy Advised to check BP weekly and add to Babyscripts Advised to look for childbirth and breastfeeding classes Advised next visit will be virtual due to  Covid.  Always can check with the office for an extra visit if she is not doing well. Did not mention abdominal cramps to provider. Fundus measures at 2 FB below the umbilicus  - AFP, Serum, Open Spina Bifida  COVID-19 Vaccine Counseling: The patient was counseled on the potential benefits and lack of known risks of COVID vaccination, during pregnancy and breastfeeding, during today's visit. The patient's questions and concerns were addressed today, including safety of the vaccination and potential side effects as they have been published by ACOG and [facility]. The patient has been informed that there have not been any documented vaccine related injuries, deaths or birth defects to infant or mom after receiving the COVID-19 vaccine to date. The patient has been made aware that although she is not at increased risk of contracting COVID-19 during pregnancy, she is at increased risk of developing severe disease and complications if she contracts COVID-19 while pregnant. All patient questions were addressed during our visit today. The patient is still unsure of her decision for vaccination.    Preterm labor symptoms and general obstetric precautions including but not limited to vaginal bleeding, contractions, leaking of fluid and fetal movement were reviewed in detail with the patient. Please refer to After Visit Summary for other counseling recommendations.  Return in about 4 weeks (around 04/03/2020) for virtual ROB.  Nolene Bernheim, RN, MSN, NP-BC Nurse Practitioner, [facility] for [employer], [facility] 03/06/2020 11:41 AM

## 2020-03-08 LAB — AFP, SERUM, OPEN SPINA BIFIDA
AFP MoM: 1
AFP Value: 41 ng/mL
Gest. Age on Collection Date: 17.5 weeks
Maternal Age At EDD: 21.5 yr
OSBR Risk 1 IN: 10000
Test Results:: NEGATIVE
Weight: 151 [lb_av]

## 2020-03-16 ENCOUNTER — Other Ambulatory Visit: Payer: Self-pay

## 2020-03-16 ENCOUNTER — Ambulatory Visit: Payer: Medicaid Other | Attending: Advanced Practice Midwife

## 2020-03-16 ENCOUNTER — Other Ambulatory Visit: Payer: Self-pay | Admitting: *Deleted

## 2020-03-16 DIAGNOSIS — Z3400 Encounter for supervision of normal first pregnancy, unspecified trimester: Secondary | ICD-10-CM | POA: Insufficient documentation

## 2020-03-16 DIAGNOSIS — O283 Abnormal ultrasonic finding on antenatal screening of mother: Secondary | ICD-10-CM

## 2020-03-17 ENCOUNTER — Encounter: Payer: Self-pay | Admitting: Advanced Practice Midwife

## 2020-03-17 DIAGNOSIS — O283 Abnormal ultrasonic finding on antenatal screening of mother: Secondary | ICD-10-CM | POA: Insufficient documentation

## 2020-04-03 ENCOUNTER — Telehealth (INDEPENDENT_AMBULATORY_CARE_PROVIDER_SITE_OTHER): Payer: Medicaid Other | Admitting: Obstetrics and Gynecology

## 2020-04-03 DIAGNOSIS — O26892 Other specified pregnancy related conditions, second trimester: Secondary | ICD-10-CM | POA: Diagnosis not present

## 2020-04-03 DIAGNOSIS — M5432 Sciatica, left side: Secondary | ICD-10-CM

## 2020-04-03 DIAGNOSIS — Z3A21 21 weeks gestation of pregnancy: Secondary | ICD-10-CM

## 2020-04-03 DIAGNOSIS — O26899 Other specified pregnancy related conditions, unspecified trimester: Secondary | ICD-10-CM

## 2020-04-03 DIAGNOSIS — O283 Abnormal ultrasonic finding on antenatal screening of mother: Secondary | ICD-10-CM | POA: Diagnosis not present

## 2020-04-03 DIAGNOSIS — Z6791 Unspecified blood type, Rh negative: Secondary | ICD-10-CM

## 2020-04-03 DIAGNOSIS — Z3400 Encounter for supervision of normal first pregnancy, unspecified trimester: Secondary | ICD-10-CM

## 2020-04-03 NOTE — Progress Notes (Signed)
    TELEHEALTH OBSTETRICS VISIT ENCOUNTER NOTE  Provider location: [facility] [employer] at [place]   I connected with Becky Mclaughlin on 04/03/20 at  8:55 AM EST by telephone at home and verified that I am speaking with the correct person using two identifiers. Of note, unable to do video encounter due to technical difficulties.    I discussed the limitations, risks, security and privacy concerns of performing an evaluation and management service by telephone and the availability of in person appointments. I also discussed with the patient that there may be a patient responsible charge related to this service. The patient expressed understanding and agreed to proceed.  Patient is at home, provider is in the office.   Subjective:  Becky Mclaughlin is a 20 y.o. G1P0 at [redacted]w[redacted]d being followed for ongoing prenatal care.  She is currently monitored for the following issues for this low-risk pregnancy and has Chest pain; Lightheadedness; Supervision of normal first pregnancy, antepartum; Rh negative, antepartum; Sciatic nerve pain, left; Depressive disorder; Gastroesophageal reflux disease without esophagitis; Generalized anxiety disorder with panic attacks; Hypertrophy of vulva; Henoch-Schonlein purpura (HCC); Headache; and Echogenic intracardiac focus of fetus on prenatal ultrasound on their problem list.  Patient reports no complaints. Reports fetal movement. Denies any contractions, bleeding or leaking of fluid.   The following portions of the patient's history were reviewed and updated as appropriate: allergies, current medications, past family history, past medical history, past social history, past surgical history and problem list.   Objective:  Blood pressure 118/66, pulse 66, last menstrual period 10/06/2019. General:  Alert, oriented and cooperative.   Mental Status: Normal mood and affect perceived. Normal judgment and thought content.  Rest of physical exam deferred due to type of  encounter  Assessment and Plan:  Pregnancy: G1P0 at [redacted]w[redacted]d 1. Echogenic intracardiac focus of fetus on prenatal ultrasound  NIPS negative Discussed this with MFM  2. Rh negative, antepartum  @ 28 weeks   3. Sciatic nerve pain, left Improving, much better.   Preterm labor symptoms and general obstetric precautions including but not limited to vaginal bleeding, contractions, leaking of fluid and fetal movement were reviewed in detail with the patient.  I discussed the assessment and treatment plan with the patient. The patient was provided an opportunity to ask questions and all were answered. The patient agreed with the plan and demonstrated an understanding of the instructions. The patient was advised to call back or seek an in-person office evaluation/go to MAU at [facility] for any urgent or concerning symptoms. Please refer to After Visit Summary for other counseling recommendations.   I provided 10 minutes of non-face-to-face time during this encounter.  No follow-ups on file.  Future Appointments  Date Time Provider Department Center  04/03/2020  8:55 AM Rasch, Harolyn Rutherford, NP [facility] None  04/14/2020  9:45 AM [facility] NURSE [facility]  04/14/2020 10:00 AM [facility] US1 [facility]    Venia Carbon, NP [facility] [employer], [facility]

## 2020-04-03 NOTE — Progress Notes (Signed)
+   Fetal movement. No complaints.  

## 2020-04-10 ENCOUNTER — Telehealth: Payer: Self-pay

## 2020-04-10 ENCOUNTER — Other Ambulatory Visit: Payer: Self-pay | Admitting: *Deleted

## 2020-04-10 ENCOUNTER — Encounter: Payer: Self-pay | Admitting: *Deleted

## 2020-04-10 DIAGNOSIS — F419 Anxiety disorder, unspecified: Secondary | ICD-10-CM

## 2020-04-10 DIAGNOSIS — O99340 Other mental disorders complicating pregnancy, unspecified trimester: Secondary | ICD-10-CM

## 2020-04-10 MED ORDER — HYDROXYZINE PAMOATE 25 MG PO CAPS: 25.0000 mg | ORAL_CAPSULE | Freq: Every day | ORAL | 0 refills | Status: DC | PRN

## 2020-04-10 NOTE — Progress Notes (Signed)
Per Blanche East, NP  Ok to refill Hydroxyzine. Rx sent today.

## 2020-04-10 NOTE — Telephone Encounter (Signed)
Called pt related to mychart message that she sent about increased anxiety. Pt denies suicidal ideations. Advised that information was forwarded to provider for review about possibly starting medication.

## 2020-04-14 ENCOUNTER — Ambulatory Visit: Payer: Medicaid Other | Attending: Obstetrics | Admitting: *Deleted

## 2020-04-14 ENCOUNTER — Other Ambulatory Visit: Payer: Self-pay

## 2020-04-14 ENCOUNTER — Encounter: Payer: Self-pay | Admitting: *Deleted

## 2020-04-14 ENCOUNTER — Ambulatory Visit (HOSPITAL_BASED_OUTPATIENT_CLINIC_OR_DEPARTMENT_OTHER): Payer: Medicaid Other

## 2020-04-14 DIAGNOSIS — Z3A23 23 weeks gestation of pregnancy: Secondary | ICD-10-CM

## 2020-04-14 DIAGNOSIS — O26899 Other specified pregnancy related conditions, unspecified trimester: Secondary | ICD-10-CM

## 2020-04-14 DIAGNOSIS — O283 Abnormal ultrasonic finding on antenatal screening of mother: Secondary | ICD-10-CM | POA: Diagnosis present

## 2020-04-14 DIAGNOSIS — O359XX0 Maternal care for (suspected) fetal abnormality and damage, unspecified, not applicable or unspecified: Secondary | ICD-10-CM

## 2020-04-14 DIAGNOSIS — M5432 Sciatica, left side: Secondary | ICD-10-CM

## 2020-04-14 DIAGNOSIS — Z6791 Unspecified blood type, Rh negative: Secondary | ICD-10-CM

## 2020-04-16 ENCOUNTER — Telehealth: Payer: PRIVATE HEALTH INSURANCE | Admitting: Obstetrics and Gynecology

## 2020-05-01 ENCOUNTER — Telehealth (INDEPENDENT_AMBULATORY_CARE_PROVIDER_SITE_OTHER): Payer: Medicaid Other | Admitting: Obstetrics and Gynecology

## 2020-05-01 DIAGNOSIS — O283 Abnormal ultrasonic finding on antenatal screening of mother: Secondary | ICD-10-CM | POA: Diagnosis not present

## 2020-05-01 DIAGNOSIS — M5432 Sciatica, left side: Secondary | ICD-10-CM | POA: Diagnosis not present

## 2020-05-01 DIAGNOSIS — Z6791 Unspecified blood type, Rh negative: Secondary | ICD-10-CM

## 2020-05-01 DIAGNOSIS — Z3400 Encounter for supervision of normal first pregnancy, unspecified trimester: Secondary | ICD-10-CM

## 2020-05-01 DIAGNOSIS — Z3A25 25 weeks gestation of pregnancy: Secondary | ICD-10-CM

## 2020-05-01 DIAGNOSIS — O26899 Other specified pregnancy related conditions, unspecified trimester: Secondary | ICD-10-CM

## 2020-05-01 DIAGNOSIS — O99891 Other specified diseases and conditions complicating pregnancy: Secondary | ICD-10-CM

## 2020-05-01 NOTE — Patient Instructions (Signed)
Rh Incompatibility Rh incompatibility is a condition that occurs during pregnancy if a woman has Rh-negative blood and her baby has Rh-positive blood. "Rh-negative" and "Rh-positive" refer to whether or not the blood has a specific protein found on the surface of red blood cells (Rh factor). If a woman has Rh factor, she is Rh-positive. If she does not have an Rh factor, she is Rh-negative. Having or not having an Rh factor does not affect the mother's general health. However, it can cause problems during pregnancy. What kind of problems can Rh incompatibility cause? During pregnancy, blood from the baby can cross into the mother's bloodstream, especially during delivery. If a mother is Rh-negative and the baby is Rh-positive, the mother's defense (immune) system will react to the baby's blood as if it were a foreign substance and will create certain proteins (antibodies) in response to it. This process is called sensitization. Once the mother is sensitized, her Rh antibodies will cross the placenta in future pregnancies and attack the baby's Rh-positive blood as if it were a harmful substance. Rh incompatibility can also happen if a pregnant woman who is Rh-negative receives a donation (transfusion) of Rh-positive blood. How does this condition affect my baby? The Rh antibodies that attack and destroy your baby's red blood cells can lead to hemolytic disease in the baby. This is a condition in which red blood cells break down. This can cause:  Yellowing of the skin and the whites of the eyes (jaundice).  Fewer red blood cells in the body (anemia).  Brain damage.  Heart failure.  Death. These antibodies usually do not cause problems during a woman's first pregnancy. This is because blood from the baby often crosses into the mother's bloodstream during delivery, and the baby is born before many of the antibodies can develop. However, once antibodies have formed, they stay in a woman's body. Because  of this, Rh incompatibility is more likely to cause problems in second or later pregnancies (if the baby is Rh-positive). How is this diagnosed? When you become pregnant, you may have blood tests to determine your blood type and Rh factor. If you are Rh-negative, you may have another blood test called an antibody screen. The antibody screen shows whether you have Rh antibodies in your blood. If you do, it may mean that you have been exposed to Rh-positive blood before, and that you have a risk for Rh incompatibility. To find out whether your baby is developing hemolytic anemia and how serious it is, health care providers may use more advanced tests, such as ultrasound.   How is Rh incompatibility treated? This condition is treated with two shots (injections) of medicine called Rho (D) immune globulin. This medicine keeps your body from making antibodies that can cause serious problems for your baby or for future babies. You will get one shot around your seventh month of pregnancy and the other within 72 hours of your baby's birth. If you are Rh-negative, you will need this medicine every time you have a baby with Rh-positive blood. If you are Rh-negative and there is a high risk of blood transfer between you and your baby, you may be given Rho (D) immune globulin. The risk of blood transfer is high if you experience:  Amniocentesis. This is a procedure to remove a small amount of the fluid that surrounds a baby in the uterus (amniotic fluid) so that it can be tested.  A miscarriage or an abortion.  An ectopic pregnancy. This is a pregnancy   in which the fertilized egg attaches (implants) outside the uterus.  Any vaginal bleeding during pregnancy. If you already have antibodies in your blood, your pregnancy will be closely monitored. You will not be given Rho (D) immune globulin because it is not effective in these cases. Summary  Rh incompatibility is a condition that occurs during pregnancy if a  woman has Rh-negative blood and her baby has Rh-positive blood.  If a mother is Rh-negative and the baby is Rh-positive, the mother's immune system will react to the baby's blood as if it were a foreign substance, and will create antibodies.  Rh antibodies that attack and destroy the baby's red blood cells can lead to hemolytic disease in the baby.  This condition is treated with a shot of medicine called Rho (D) immune globulin. This medicine keeps the woman's body from making antibodies that can cause serious problems in the baby or future babies. This information is not intended to replace advice given to you by your health care provider. Make sure you discuss any questions you have with your health care provider. Document Revised: 01/20/2017 Document Reviewed: 04/05/2016 Elsevier Patient Education  2021 [employer].

## 2020-05-01 NOTE — Progress Notes (Signed)
S/w pt for virtual visit, pt reports fetal movement, no pain today.

## 2020-05-01 NOTE — Progress Notes (Signed)
    TELEHEALTH OBSTETRICS VISIT ENCOUNTER NOTE  Provider location: [facility] [employer] at [place]   I connected with Becky Mclaughlin on 05/01/20 at 10:10 AM EST by telephone at home and verified that I am speaking with the correct person using two identifiers. Of note, unable to do video encounter due to technical difficulties.    Patient is at home and provider is in the office.   I discussed the limitations, risks, security and privacy concerns of performing an evaluation and management service by telephone and the availability of in person appointments. I also discussed with the patient that there may be a patient responsible charge related to this service. The patient expressed understanding and agreed to proceed.  Subjective:  Becky Mclaughlin is a 20 y.o. G1P0 at [redacted]w[redacted]d being followed for ongoing prenatal care.  She is currently monitored for the following issues for this low-risk pregnancy and has Supervision of normal first pregnancy, antepartum; Rh negative, antepartum; Sciatic nerve pain, left; Depressive disorder; Gastroesophageal reflux disease without esophagitis; Generalized anxiety disorder with panic attacks; Hypertrophy of vulva; Henoch-Schonlein purpura (HCC); and Echogenic intracardiac focus of fetus on prenatal ultrasound on their problem list.  Patient reports no complaints. Reports fetal movement. Denies any contractions, bleeding or leaking of fluid.   The following portions of the patient's history were reviewed and updated as appropriate: allergies, current medications, past family history, past medical history, past social history, past surgical history and problem list.   Objective:  Blood pressure 108/62, pulse 71, last menstrual period 10/06/2019. General:  Alert, oriented and cooperative.   Mental Status: Normal mood and affect perceived. Normal judgment and thought content.  Rest of physical exam deferred due to type of encounter  Assessment and Plan:   Pregnancy: G1P0 at [redacted]w[redacted]d 1. Echogenic intracardiac focus of fetus on prenatal ultrasound  No further [place] needed, per MFM   2. Rh negative, antepartum  Discussed this in detail  Rhogam next visit @ 28 weeks   3. Sciatic nerve pain, left  Comes and goes, has not been an issue lately.   4. Supervision of normal first pregnancy, antepartum  Noticed bumps under her armpits since she became pregnant. At times the bumps drain. They are painful. Advised no shaving, if she insists on shaving use a brand new clean razor. Will assess at next in-person visit. She may need oral antibiotics.   [facility] baby website discussed with tour and classes offered.   Preterm labor symptoms and general obstetric precautions including but not limited to vaginal bleeding, contractions, leaking of fluid and fetal movement were reviewed in detail with the patient.  I discussed the assessment and treatment plan with the patient. The patient was provided an opportunity to ask questions and all were answered. The patient agreed with the plan and demonstrated an understanding of the instructions. The patient was advised to call back or seek an in-person office evaluation/go to [facility]U at [facility] for any urgent or concerning symptoms. Please refer to After Visit Summary for other counseling recommendations.   I provided 13 minutes of non-face-to-face time during this encounter.  Return in about 4 weeks (around 05/29/2020), or in person visit in 4 weeks for 2 hour GTT and Rhogam.  Future Appointments  Date Time Provider Department Center  05/01/2020 10:10 AM Lacresia Darwish, Harolyn Rutherford, NP [facility] None    Venia Carbon, NP [facility] [employer], [facility]

## 2020-05-29 ENCOUNTER — Encounter: Payer: Self-pay | Admitting: Nurse Practitioner

## 2020-05-29 ENCOUNTER — Ambulatory Visit (INDEPENDENT_AMBULATORY_CARE_PROVIDER_SITE_OTHER): Payer: Medicaid Other | Admitting: Nurse Practitioner

## 2020-05-29 ENCOUNTER — Other Ambulatory Visit: Payer: Medicaid Other

## 2020-05-29 ENCOUNTER — Other Ambulatory Visit: Payer: Self-pay

## 2020-05-29 VITALS — BP 123/75 | HR 75 | Wt 160.0 lb

## 2020-05-29 DIAGNOSIS — Z3400 Encounter for supervision of normal first pregnancy, unspecified trimester: Secondary | ICD-10-CM

## 2020-05-29 DIAGNOSIS — Z3A29 29 weeks gestation of pregnancy: Secondary | ICD-10-CM

## 2020-05-29 DIAGNOSIS — Z23 Encounter for immunization: Secondary | ICD-10-CM

## 2020-05-29 DIAGNOSIS — Z3402 Encounter for supervision of normal first pregnancy, second trimester: Secondary | ICD-10-CM

## 2020-05-29 MED ORDER — RHO D IMMUNE GLOBULIN 1500 UNIT/2ML IJ SOSY
300.0000 ug | PREFILLED_SYRINGE | Freq: Once | INTRAMUSCULAR | Status: AC
  Administered 2020-05-29: 300 ug via INTRAMUSCULAR

## 2020-05-29 NOTE — Patient Instructions (Addendum)
Check [email or web address] for info on classes.   Intrauterine Device Information An intrauterine device (IUD) is a medical device that is inserted into the uterus to prevent pregnancy. It is a small, T-shaped device that has one or two nylon strings hanging down from it. The strings hang out of the lower part of the uterus (cervix) to allow for future IUD removal. There are two types of IUDs:  Hormone IUD. This type of IUD is made of plastic and contains the hormone progestin (synthetic progesterone). A hormone IUD may last 3-5 years.  Copper IUD. This type of IUD has copper wire wrapped around it. A copper IUD may last up to 10 years. How is an IUD inserted? An IUD is inserted through the vagina, through the cervix, and into the uterus with a minor medical procedure. The procedure for IUD insertion may vary among health care providers and hospitals. How does an IUD work? Synthetic progesterone in a hormonal IUD prevents pregnancy by:  Thickening cervical mucus to prevent sperm from entering the uterus.  Thinning the uterine lining to prevent a fertilized egg from being implanted there. Copper in a copper IUD prevents pregnancy by making the uterus and fallopian tubes produce a fluid that kills sperm. What are the advantages of an IUD? Advantages of either type of IUD An IUD:  Is highly effective in preventing pregnancy.  Is reversible. You can become pregnant shortly after the IUD is removed.  Is low-maintenance and can stay in place for a long time.  Has no estrogen-related side effects.  Can be used when breastfeeding.  Is not associated with weight gain.  Can be inserted right after childbirth, an abortion, or a miscarriage. Advantages of a hormone IUD  If it is inserted within 7 days of your period starting, it works right after it has been inserted. If the hormone IUD is inserted at any other time in your cycle, you will need to use a backup method of birth control for 7  days after insertion.  It can make menstrual periods lighter or stop completely.  It can reduce menstrual cramping and other discomforts from menstrual periods.  It can be used for 3-5 years, depending on which IUD you have. Advantages of a copper IUD  It works right after it is inserted.  It can be used as a form of emergency birth control if it is inserted within 5 days after having unprotected sex.  It does not interfere with your body's natural hormones.  It can be used for up to 10 years. What are the disadvantages of an IUD?  An IUD may cause irregular menstrual bleeding for a period of time after insertion.  It is common to have pain during insertion and have cramping and vaginal bleeding after insertion.  An IUD may cut the uterus (uterine perforation) when it is inserted. This is rare.  Pelvic inflammatory disease (PID) may happen after insertion of an IUD. PID is an infection in the uterus and fallopian tubes. The IUD does not cause the infection. The infection is usually from an unknown sexually transmitted infection (STI). This is rare, and it usually happens during the first 20 days after the IUD is inserted.  A copper IUD can make your menstrual flow heavier and more painful.  IUDs cannot prevent sexually transmitted infections (STIs). How is an IUD removed?   You will lie on your back with your knees bent and your feet in footrests (stirrups).  A device will be inserted into  your vagina to spread apart the vaginal walls (speculum). This will allow your health care provider to see the strings attached to the IUD.  Your health care provider will use a small instrument (forceps) to grasp the IUD strings and will pull firmly until the IUD is removed. You may have some discomfort when the IUD is removed. Your health care provider may recommend taking over-the-counter pain relievers, such as ibuprofen, before the procedure. You may also have minor spotting for a few days  after the procedure. The procedure for IUD removal may vary among health care providers and hospitals. Is an IUD right for me? If you are interested in an IUD, discuss it with your health care provider. He or she will make sure you are a good candidate for an IUD and will let you know more about the advantages, disadvantage, and possible side effects. This will allow you to make a decision about the device. Summary  An intrauterine device (IUD) is a medical device that is inserted in the uterus to prevent pregnancy. It is a small, T-shaped device that has one or two nylon strings hanging down from it.  A hormone IUD contains the hormone progestin (synthetic progesterone). A copper IUD has copper wire wrapped around it.  Synthetic progesterone in a hormone IUD prevents pregnancy by thickening cervical mucus and thinning the walls of the uterus. Copper in a copper IUD prevents pregnancy by making the uterus and fallopian tubes produce a fluid that kills sperm.  A hormone IUD can be left in place for 3-5 years. A copper IUD can be left in place for up to 10 years.  An IUD is inserted and removed by a health care provider. You may feel some pain during insertion and removal. Your health care provider may recommend taking over-the-counter pain medicine, such as ibuprofen, before an IUD procedure. This information is not intended to replace advice given to you by your health care provider. Make sure you discuss any questions you have with your health care provider. Document Revised: 08/21/2019 Document Reviewed: 08/21/2019 Elsevier Patient Education  2021 Elsevier Inc. Etonogestrel implant What is this medicine? ETONOGESTREL (et oh noe JES trel) is a contraceptive (birth control) device. It is used to prevent pregnancy. It can be used for up to 3 years. This medicine may be used for other purposes; ask your health care provider or pharmacist if you have questions. COMMON BRAND NAME(S): Implanon,  Nexplanon What should I tell my health care provider before I take this medicine? They need to know if you have any of these conditions:  abnormal vaginal bleeding  blood vessel disease or blood clots  breast, cervical, endometrial, ovarian, liver, or uterine cancer  diabetes  gallbladder disease  heart disease or recent heart attack  high blood pressure  high cholesterol or triglycerides  kidney disease  liver disease  migraine headaches  seizures  stroke  tobacco smoker  an unusual or allergic reaction to etonogestrel, anesthetics or antiseptics, other medicines, foods, dyes, or preservatives  pregnant or trying to get pregnant  breast-feeding How should I use this medicine? This device is inserted just under the skin on the inner side of your upper arm by a health care professional. Talk to your pediatrician regarding the use of this medicine in children. Special care may be needed. Overdosage: If you think you have taken too much of this medicine contact a poison control center or emergency room at once. NOTE: This medicine is only for you. Do  not share this medicine with others. What if I miss a dose? This does not apply. What may interact with this medicine? Do not take this medicine with any of the following medications:  amprenavir  fosamprenavir This medicine may also interact with the following medications:  acitretin  aprepitant  armodafinil  bexarotene  bosentan  carbamazepine  certain medicines for fungal infections like fluconazole, ketoconazole, itraconazole and voriconazole  certain medicines to treat hepatitis, HIV or AIDS  cyclosporine  felbamate  griseofulvin  lamotrigine  modafinil  oxcarbazepine  phenobarbital  phenytoin  primidone  rifabutin  rifampin  rifapentine  St. John's wort  topiramate This list may not describe all possible interactions. Give your health care provider a list of all the  medicines, herbs, non-prescription drugs, or dietary supplements you use. Also tell them if you smoke, drink alcohol, or use illegal drugs. Some items may interact with your medicine. What should I watch for while using this medicine? This product does not protect you against HIV infection (AIDS) or other sexually transmitted diseases. You should be able to feel the implant by pressing your fingertips over the skin where it was inserted. Contact your doctor if you cannot feel the implant, and use a non-hormonal birth control method (such as condoms) until your doctor confirms that the implant is in place. Contact your doctor if you think that the implant may have broken or become bent while in your arm. You will receive a user card from your health care provider after the implant is inserted. The card is a record of the location of the implant in your upper arm and when it should be removed. Keep this card with your health records. What side effects may I notice from receiving this medicine? Side effects that you should report to your doctor or health care professional as soon as possible:  allergic reactions like skin rash, itching or hives, swelling of the face, lips, or tongue  breast lumps, breast tissue changes, or discharge  breathing problems  changes in emotions or moods  coughing up blood  if you feel that the implant may have broken or bent while in your arm  high blood pressure  pain, irritation, swelling, or bruising at the insertion site  scar at site of insertion  signs of infection at the insertion site such as fever, and skin redness, pain or discharge  signs and symptoms of a blood clot such as breathing problems; changes in vision; chest pain; severe, sudden headache; pain, swelling, warmth in the leg; trouble speaking; sudden numbness or weakness of the face, arm or leg  signs and symptoms of liver injury like dark yellow or brown urine; general ill feeling or  flu-like symptoms; light-colored stools; loss of appetite; nausea; right upper belly pain; unusually weak or tired; yellowing of the eyes or skin  unusual vaginal bleeding, discharge Side effects that usually do not require medical attention (report to your doctor or health care professional if they continue or are bothersome):  acne  breast pain or tenderness  headache  irregular menstrual bleeding  nausea This list may not describe all possible side effects. Call your doctor for medical advice about side effects. You may report side effects to FDA at 1-800-FDA-1088. Where should I keep my medicine? This drug is given in a hospital or clinic and will not be stored at home. NOTE: This sheet is a summary. It may not cover all possible information. If you have questions about this medicine, talk to  your doctor, pharmacist, or health care provider.  2021 Elsevier/Gold Standard (2018-11-20 11:33:04)

## 2020-05-29 NOTE — Progress Notes (Signed)
    Subjective:  Becky Mclaughlin is a 20 y.o. G1P0 at [redacted]w[redacted]d being seen today for ongoing prenatal care.  She is currently monitored for the following issues for this low-risk pregnancy and has Supervision of normal first pregnancy, antepartum; Rh negative, antepartum; Sciatic nerve pain, left; Depressive disorder; Gastroesophageal reflux disease without esophagitis; Generalized anxiety disorder with panic attacks; Hypertrophy of vulva; Henoch-Schonlein purpura (HCC); and Echogenic intracardiac focus of fetus on prenatal ultrasound on their problem list.  Patient reports no complaints.  Contractions: Not present. Vag. Bleeding: None.  Movement: Present. Denies leaking of fluid.   The following portions of the patient's history were reviewed and updated as appropriate: allergies, current medications, past family history, past medical history, past social history, past surgical history and problem list. Problem list updated.  Objective:   Vitals:   05/29/20 0859  BP: 123/75  Pulse: 75  Weight: 160 lb (72.6 kg)    Fetal Status: Fetal Heart Rate (bpm): 140 Fundal Height: 27 cm Movement: Present     General:  Alert, oriented and cooperative. Patient is in no acute distress.  Skin: Skin is warm and dry. No rash noted.   Cardiovascular: Normal heart rate noted  Respiratory: Normal respiratory effort, no problems with respiration noted  Abdomen: Soft, gravid, appropriate for gestational age. Pain/Pressure: Present     Pelvic:  Cervical exam deferred        Extremities: Normal range of motion.  Edema: None  Mental Status: Normal mood and affect. Normal behavior. Normal judgment and thought content.   Urinalysis:      Assessment and Plan:  Pregnancy: G1P0 at [redacted]w[redacted]d  1. Supervision of normal first pregnancy, antepartum Rhogam and TDAP given today She has moved to [facility].  She and her partner asked questions about getting to the hospital for birth. Considering changing practices to  something closer.  Will let [place] know.  Advised to check on other birthing options but highly recommend coming to [facility] for birth. Reviewed getting childbirth and breastfeeding classes that will be helpful. Reviewed finding a pediatrician in their area. Has used pills before.  Not familiar with other contraceptive methods.  Info in AVS.  - Glucose Tolerance, 2 Hours w/1 Hour - CBC - RPR - HIV Antibody (routine testing w rflx)   Preterm labor symptoms and general obstetric precautions including but not limited to vaginal bleeding, contractions, leaking of fluid and fetal movement were reviewed in detail with the patient. Please refer to After Visit Summary for other counseling recommendations.  Return in about 2 weeks (around 06/12/2020) for in person ROB.  Nolene Bernheim, RN, MSN, NP-BC Nurse Practitioner, [facility] for [employer], [facility] 05/29/2020 9:46 AM

## 2020-05-29 NOTE — Progress Notes (Signed)
+   Fetal movement. No complaints.  

## 2020-05-30 LAB — CBC
Hematocrit: 39.3 % (ref 34.0–46.6)
Hemoglobin: 12.9 g/dL (ref 11.1–15.9)
MCH: 29.9 pg (ref 26.6–33.0)
MCHC: 32.8 g/dL (ref 31.5–35.7)
MCV: 91 fL (ref 79–97)
Platelets: 318 10*3/uL (ref 150–450)
RBC: 4.31 x10E6/uL (ref 3.77–5.28)
RDW: 12 % (ref 11.7–15.4)
WBC: 10.3 10*3/uL (ref 3.4–10.8)

## 2020-05-30 LAB — HIV ANTIBODY (ROUTINE TESTING W REFLEX): HIV Screen 4th Generation wRfx: NONREACTIVE

## 2020-05-30 LAB — RPR: RPR Ser Ql: NONREACTIVE

## 2020-05-30 LAB — GLUCOSE TOLERANCE, 2 HOURS W/ 1HR
Glucose, 1 hour: 111 mg/dL (ref 65–179)
Glucose, 2 hour: 74 mg/dL (ref 65–152)
Glucose, Fasting: 74 mg/dL (ref 65–91)

## 2020-06-12 ENCOUNTER — Ambulatory Visit (INDEPENDENT_AMBULATORY_CARE_PROVIDER_SITE_OTHER): Payer: Medicaid Other

## 2020-06-12 ENCOUNTER — Other Ambulatory Visit: Payer: Self-pay

## 2020-06-12 VITALS — BP 130/79 | HR 85 | Wt 161.0 lb

## 2020-06-12 DIAGNOSIS — Z3A31 31 weeks gestation of pregnancy: Secondary | ICD-10-CM

## 2020-06-12 DIAGNOSIS — Z3400 Encounter for supervision of normal first pregnancy, unspecified trimester: Secondary | ICD-10-CM

## 2020-06-12 NOTE — Progress Notes (Signed)
Pt reports fetal movement, denies pain.  

## 2020-06-12 NOTE — Progress Notes (Signed)
   LOW-RISK PREGNANCY OFFICE VISIT  Patient name: Becky Mclaughlin MRN [id number]  Date of birth: January 02, 2001 Chief Complaint:   Routine Prenatal Visit  Subjective:   Becky Mclaughlin is a 20 y.o. G1P0 female at [redacted]w[redacted]d with an Estimated Date of Delivery: 08/09/20 being seen today for ongoing management of a Low-risk pregnancy aeb has Supervision of normal first pregnancy, antepartum; Rh negative, antepartum; Sciatic nerve pain, left; Depressive disorder; Gastroesophageal reflux disease without esophagitis; Generalized anxiety disorder with panic attacks; Hypertrophy of vulva; Henoch-Schonlein purpura (HCC); and Echogenic intracardiac focus of fetus on prenatal ultrasound on their problem list.  Patient presents today with no complaints.  Reports ice craving and desire to play volleyball. Patient endorses fetal movement. Patient denies abdominal cramping or contractions.  Patient denies vaginal concerns including abnormal discharge, leaking of fluid, and bleeding.  Contractions: Not present. Vag. Bleeding: None.  Movement: Present.  Reviewed past medical,surgical, social, obstetrical and family history as well as problem list, medications and allergies.  Objective   Vitals:   06/12/20 1035  BP: 130/79  Pulse: 85  Weight: 161 lb (73 kg)  Body mass index is 23.78 kg/m.  Total Weight Gain:11 lb (4.99 kg)         Physical Examination:   General appearance: Well appearing, and in no distress  Mental status: Alert, oriented to person, place, and time  Skin: Warm & dry  Cardiovascular: Normal heart rate noted  Respiratory: Normal respiratory effort, no distress  Abdomen: Soft, gravid, nontender, AGA with Fundal Height: 31 cm  Pelvic: Cervical exam deferred           Extremities: Edema: None  Fetal Status: Fetal Heart Rate (bpm): 140  Movement: Present   No results found for this or any previous visit (from the past 24 hour(s)).  Assessment & Plan:  Low-risk pregnancy of a 20 y.o., G1P0 at [redacted]w[redacted]d  with an Estimated Date of Delivery: 08/09/20   1. Supervision of normal first pregnancy, antepartum -Anticipatory guidance for upcoming appts. -Patient to next appt in 2 weeks for an in-person visit. -Discussed patient desire to play volleyball. -Reviewed potential for injury with change in equilibrium d/t pregnancy. -Encouraged to "listen to body" and know that she may not be able to do things like she did prior to pregnancy. -Cautioned that strenuous activity will bring about aches/pains as well as increased potential for injury.   2. [redacted] weeks gestation of pregnancy -Doing well overall. -Reviewed iron level and normal at 12.6 on 4/8. -Reassured that it is not abnormal to have some pica cravings in pregnancy. -Encouraged to increase hydration and reassured that it is okay to chew ice in moderation.   Meds: No orders of the defined types were placed in this encounter.  Labs/procedures today:  Lab Orders  No laboratory test(s) ordered today     Reviewed: Preterm labor symptoms and general obstetric precautions including but not limited to vaginal bleeding, contractions, leaking of fluid and fetal movement were reviewed in detail with the patient.  All questions were answered.  Follow-up: Return in about 2 weeks (around 06/26/2020) for LROB.  No orders of the defined types were placed in this encounter.  Cherre Robins MSN, CNM 06/12/2020

## 2020-06-22 ENCOUNTER — Other Ambulatory Visit: Payer: Self-pay

## 2020-06-22 ENCOUNTER — Ambulatory Visit (INDEPENDENT_AMBULATORY_CARE_PROVIDER_SITE_OTHER): Payer: Self-pay | Admitting: Pediatrics

## 2020-06-22 DIAGNOSIS — Z7681 Expectant parent(s) prebirth pediatrician visit: Secondary | ICD-10-CM | POA: Insufficient documentation

## 2020-06-22 NOTE — Progress Notes (Signed)
Prenatal counseling for impending newborn done. Prenatal care started at 6 weeks. Vaccine policy and schedule reviewed and agreed to.

## 2020-06-26 ENCOUNTER — Other Ambulatory Visit: Payer: Self-pay

## 2020-06-26 ENCOUNTER — Ambulatory Visit (INDEPENDENT_AMBULATORY_CARE_PROVIDER_SITE_OTHER): Payer: Medicaid Other | Admitting: Student

## 2020-06-26 VITALS — BP 117/72 | HR 77 | Wt 164.4 lb

## 2020-06-26 DIAGNOSIS — Z3400 Encounter for supervision of normal first pregnancy, unspecified trimester: Secondary | ICD-10-CM

## 2020-06-26 DIAGNOSIS — Z3A33 33 weeks gestation of pregnancy: Secondary | ICD-10-CM

## 2020-06-26 DIAGNOSIS — Z3403 Encounter for supervision of normal first pregnancy, third trimester: Secondary | ICD-10-CM

## 2020-06-26 NOTE — Progress Notes (Signed)
Patient ID: Zanae Kuehnle, female   DOB: 22-Apr-2000, 22 y.o.   MRN: [id number]   PRENATAL VISIT NOTE  Subjective:  Juelle Dickmann is a 20 y.o. G1P0 at [redacted]w[redacted]d being seen today for ongoing prenatal care.  She is currently monitored for the following issues for this low-risk pregnancy and has Supervision of normal first pregnancy, antepartum; Rh negative, antepartum; Sciatic nerve pain, left; Depressive disorder; Gastroesophageal reflux disease without esophagitis; Generalized anxiety disorder with panic attacks; Hypertrophy of vulva; Henoch-Schonlein purpura (HCC); Echogenic intracardiac focus of fetus on prenatal ultrasound; and Pediatric pre-birth visit for expectant parent on their problem list.  Patient reports no complaints.  Contractions: Not present. Vag. Bleeding: None.  Movement: Present. Denies leaking of fluid.   The following portions of the patient's history were reviewed and updated as appropriate: allergies, current medications, past family history, past medical history, past social history, past surgical history and problem list.   Objective:   Vitals:   06/26/20 1100  BP: 117/72  Pulse: 77  Weight: 164 lb 6.4 oz (74.6 kg)    Fetal Status: Fetal Heart Rate (bpm): 130 Fundal Height: 33 cm Movement: Present     General:  Alert, oriented and cooperative. Patient is in no acute distress.  Skin: Skin is warm and dry. No rash noted.   Cardiovascular: Normal heart rate noted  Respiratory: Normal respiratory effort, no problems with respiration noted  Abdomen: Soft, gravid, appropriate for gestational age.  Pain/Pressure: Absent     Pelvic: Cervical exam deferred        Extremities: Normal range of motion.  Edema: None  Mental Status: Normal mood and affect. Normal behavior. Normal judgment and thought content.   Assessment and Plan:  Pregnancy: G1P0 at [redacted]w[redacted]d 1. Supervision of normal first pregnancy, antepartum -still not sure about pediatrician, will make a decision. Depends on  baby's insurance (Medicaid or private) -discussed contraception; patient is unsure but thinks maybe Depo. Does not want pills.  -discussed breastfeeding; patient wants to try. Reassured her that ingrown axillary hairs will not prevent her from breastfeedin -anticipatory guidance given for 36 week labs/cultures, confirm presentation  Preterm labor symptoms and general obstetric precautions including but not limited to vaginal bleeding, contractions, leaking of fluid and fetal movement were reviewed in detail with the patient. Please refer to After Visit Summary for other counseling recommendations.   Return in about 3 weeks (around 07/17/2020), or 36 week cultures and appt.  Future Appointments  Date Time Provider Department Center  07/17/2020 11:00 AM Brock Bad, MD [facility] None    [facility] [place], [place]

## 2020-06-26 NOTE — Progress Notes (Signed)
Patient presents for ROB. Patient has no concerns today. 

## 2020-06-26 NOTE — Patient Instructions (Signed)
Tests and Screening During Pregnancy Having certain tests and screenings during pregnancy is an important part of your prenatal care. These tests help your health care provider find problems that might affect your pregnancy. Some tests must be done for all pregnant women, and some are optional. Most of the tests and screenings do not pose any risks for you or your baby. You may need additional testing if any routine tests indicate a problem. Tests and screenings done early in pregnancy Some tests and screenings you can expect to have in early pregnancy include:  Blood tests, such as: ? Complete blood count (CBC). This test is done to check your red and white blood cells. It can help identify a risk for anemia, infection, or bleeding. ? Blood typing. This test shows your blood type. It also shows whether you have a certain protein in your red blood cells called the Rh factor. It can be dangerous for your baby if you do not have this protein (Rh negative) and your baby has it (Rh positive). ? Tests to check for diseases that can cause birth defects or can be passed to your baby, such as:  German measles (rubella) and chicken pox. The test indicates whether you are immune to these diseases.  Hepatitis B and C.  Human Immunodeficiency Virus (HIV).  Syphilis.  Zika virus, if you or your partner has traveled to an area where the virus occurs.  Urine testing. This checks for sugar in your urine and for signs of infection.  Blood pressure. This is to check for high blood pressure and preeclampsia.  Testing for sexually transmitted infections (STIs), such as chlamydia or gonorrhea.  Testing for tuberculosis. You may have this skin test if you are at risk for tuberculosis.  Fetal ultrasound. This is an imaging study of your growing baby. It uses sound waves to create pictures of your baby. This test may be done to help determine your due date and to ensure you do not have an ectopic pregnancy. An  ectopic pregnancy is a pregnancy that grows outside of the uterus.   Tests and screenings done later in pregnancy Certain tests are done for the first time later in the pregnancy. Some of the tests that were done in early pregnancy are repeated at this time. Some common tests you can expect to have later in pregnancy include:  Rh antibody testing. If you are Rh negative, you will have a blood test at about 28 weeks of pregnancy to see if you are producing Rh antibodies. If you have not started to make antibodies, you will be given an injection to prevent you from making antibodies for the rest of your pregnancy.  Glucose screening. This checks your blood sugar. It will show whether you are developing the type of diabetes that occurs during pregnancy (gestational diabetes). You may have this screening earlier if you have risk factors for diabetes.  Screening for group B streptococcus (GBS). GBS is a type of bacteria that may live in your rectum or vagina. GBS can spread to your baby during birth. This is done at 35-37 weeks of pregnancy. If testing is positive for GBS, you may be treated with antibiotic medicine.  Urine and blood tests to monitor for other pregnancy problems, such as preeclampsia or anemia.  Blood pressure to monitor for high blood pressure and preeclampsia.  Fetal ultrasound. This may be repeated at 16-20 weeks to check how your baby is growing and developing.  Non-stress test. This test is   done later in pregnancy to check your baby's heart rate. This may be repeated weekly if your pregnancy is high risk.  Biophysical profile. This test includes ultrasound imaging and a non-stress test to ensure your baby is healthy. This test may help decide when your baby should be born.   Screening for birth defects Some birth defects are caused by abnormal genes passed down through families. Early in your pregnancy, tests can be done to find out if your baby is at risk for a genetic disorder.  This testing is optional. The type of testing recommended for you will depend on your family and medical history, your ethnicity, and your age. Testing may include:  Screening tests. These tests may include an ultrasound, blood tests, or a combination of both. The blood tests are used to check for abnormal genes and the ultrasound is done to look for early birth defects.  Carrier screening. This test involves checking the blood or saliva of both parents to see if they carry abnormal genes that could be passed down to a baby. If genetic screening shows that your baby is at risk for a genetic defect, additional diagnostic testing may be recommended, such as:  Amniocentesis. This involves testing a sample of fluid from your womb (amniotic fluid).  Chorionic villus sampling. In this test, a sample of cells from your placenta is checked for abnormal cells. Unlike other tests done during pregnancy, diagnostic testing does have some risk for your pregnancy. Talk to your health care provider about the risks and benefits of genetic testing. Questions to ask your health care provider  What routine tests are recommended for me?  When and how will these tests be done?  When will I get the results of routine tests?  What do the results of these tests mean for me or my baby?  Do you recommend any genetic screening tests? Which ones?  Should I see a genetic counselor before having genetic screening? Where to find more information  American Pregnancy Association: americanpregnancy.org/prenatal-testing  American College of Obstetricians and Gynecologists: www.acog.org/  Office on Women's Health: womenshealth.gov/pregnancy  March of Dimes: marchofdimes.org/pregnancy Summary  Having certain tests and screenings during pregnancy is an important part of your prenatal care. Talk to your health care provider about what tests are right for you and your baby.  In early pregnancy, testing may be done to  check your risks for various conditions that can affect you and your baby.  Later in pregnancy, tests may be done to ensure that your baby is growing normally and that you and your baby are staying healthy during the pregnancy.  Genetic testing is optional. Talk to your health care provider about the risks and benefits of genetic testing. This information is not intended to replace advice given to you by your health care provider. Make sure you discuss any questions you have with your health care provider. Document Revised: 10/29/2019 Document Reviewed: 10/29/2019 Elsevier Patient Education  2021 [employer] Inc.  

## 2020-07-17 ENCOUNTER — Other Ambulatory Visit (HOSPITAL_COMMUNITY)
Admission: RE | Admit: 2020-07-17 | Discharge: 2020-07-17 | Disposition: A | Discharge status: Home / Self Care | Payer: Medicaid Other | Source: Ambulatory Visit | Attending: Obstetrics | Admitting: Obstetrics

## 2020-07-17 ENCOUNTER — Encounter: Payer: Self-pay | Admitting: Obstetrics

## 2020-07-17 ENCOUNTER — Other Ambulatory Visit: Payer: Self-pay

## 2020-07-17 ENCOUNTER — Ambulatory Visit (INDEPENDENT_AMBULATORY_CARE_PROVIDER_SITE_OTHER): Payer: Medicaid Other | Admitting: Obstetrics

## 2020-07-17 VITALS — BP 125/82 | HR 80 | Wt 163.1 lb

## 2020-07-17 DIAGNOSIS — Z3400 Encounter for supervision of normal first pregnancy, unspecified trimester: Secondary | ICD-10-CM

## 2020-07-17 DIAGNOSIS — Z3A36 36 weeks gestation of pregnancy: Secondary | ICD-10-CM

## 2020-07-17 NOTE — Progress Notes (Signed)
Subjective:  Becky Mclaughlin is a 20 y.o. G1P0 at [redacted]w[redacted]d being seen today for ongoing prenatal care.  She is currently monitored for the following issues for this low-risk pregnancy and has Supervision of normal first pregnancy, antepartum; Rh negative, antepartum; Sciatic nerve pain, left; Depressive disorder; Gastroesophageal reflux disease without esophagitis; Generalized anxiety disorder with panic attacks; Hypertrophy of vulva; Henoch-Schonlein purpura (HCC); Echogenic intracardiac focus of fetus on prenatal ultrasound; and Pediatric pre-birth visit for expectant parent on their problem list.  Patient reports no complaints.  Contractions: Not present. Vag. Bleeding: None.  Movement: Present. Denies leaking of fluid.   The following portions of the patient's history were reviewed and updated as appropriate: allergies, current medications, past family history, past medical history, past social history, past surgical history and problem list. Problem list updated.  Objective:   Vitals:   07/17/20 1059  BP: 125/82  Pulse: 80  Weight: 163 lb 1.6 oz (74 kg)    Fetal Status:     Movement: Present     General:  Alert, oriented and cooperative. Patient is in no acute distress.  Skin: Skin is warm and dry. No rash noted.   Cardiovascular: Normal heart rate noted  Respiratory: Normal respiratory effort, no problems with respiration noted  Abdomen: Soft, gravid, appropriate for gestational age. Pain/Pressure: Absent     Pelvic:  Cervical exam deferred        Extremities: Normal range of motion.  Edema: None  Mental Status: Normal mood and affect. Normal behavior. Normal judgment and thought content.   Urinalysis:      Informal bedside ultrasound:  Breech presentation  Assessment and Plan:  Pregnancy: G1P0 at [redacted]w[redacted]d  1. Supervision of normal first pregnancy, antepartum Rx: - Cervicovaginal ancillary only - Strep Gp B NAA  2. [redacted] weeks gestation of pregnancy - breech presentation  Preterm  labor symptoms and general obstetric precautions including but not limited to vaginal bleeding, contractions, leaking of fluid and fetal movement were reviewed in detail with the patient. Please refer to After Visit Summary for other counseling recommendations.   Return in about 1 week (around 07/24/2020) for ROB with midwife.   Brock Bad, MD  07/17/20

## 2020-07-17 NOTE — Progress Notes (Signed)
Pt presents for ROB, GBS, and GC/CT.  Partner is concerned baby is breech.

## 2020-07-19 LAB — STREP GP B NAA: Strep Gp B NAA: NEGATIVE

## 2020-07-21 LAB — CERVICOVAGINAL ANCILLARY ONLY
Bacterial Vaginitis (gardnerella): NEGATIVE
Candida Glabrata: NEGATIVE
Candida Vaginitis: NEGATIVE
Chlamydia: NEGATIVE
Comment: NEGATIVE
Comment: NEGATIVE
Comment: NEGATIVE
Comment: NEGATIVE
Comment: NEGATIVE
Comment: NORMAL
Neisseria Gonorrhea: NEGATIVE
Trichomonas: NEGATIVE

## 2020-07-23 ENCOUNTER — Other Ambulatory Visit: Payer: Self-pay

## 2020-07-23 ENCOUNTER — Ambulatory Visit (INDEPENDENT_AMBULATORY_CARE_PROVIDER_SITE_OTHER): Payer: Medicaid Other | Admitting: Advanced Practice Midwife

## 2020-07-23 VITALS — BP 126/80 | HR 89 | Wt 166.8 lb

## 2020-07-23 DIAGNOSIS — O321XX0 Maternal care for breech presentation, not applicable or unspecified: Secondary | ICD-10-CM

## 2020-07-23 DIAGNOSIS — Z3400 Encounter for supervision of normal first pregnancy, unspecified trimester: Secondary | ICD-10-CM

## 2020-07-23 DIAGNOSIS — Z3A37 37 weeks gestation of pregnancy: Secondary | ICD-10-CM

## 2020-07-23 DIAGNOSIS — O368130 Decreased fetal movements, third trimester, not applicable or unspecified: Secondary | ICD-10-CM

## 2020-07-23 NOTE — Progress Notes (Signed)
Pt reports fetal movement with occasional sharp pains.

## 2020-07-23 NOTE — Patient Instructions (Signed)
Things to Try After 37 weeks to Encourage Labor/Get Ready for Labor:   1.  Try the Miles Circuit at www.milescircuit.com daily to improve baby's position and encourage the onset of labor.  2. Walk a little and rest a little every day.  Change positions often.  3. Cervical Ripening: May try one or both a. Red Raspberry Leaf capsules or tea:  two 300mg or 400mg tablets with each meal, 2-3 times a day, or 1-3 cups of tea daily  Potential Side Effects Of Raspberry Leaf:  Most women do not experience any side effects from drinking raspberry leaf tea. However, nausea and loose stools are possible   b. Evening Primrose Oil capsules: take 1 capsule by mouth and place one capsule in the vagina every night.    Some of the potential side effects:  Upset stomach  Loose stools or diarrhea  Headaches  Nausea  4. Sex can also help the cervix ripen and encourage labor onset.    Labor Precautions Reasons to come to [facility] at [facility]:  1.  Contractions are  5 minutes apart or less, each last 1 minute, these have been going on for 1-2 hours, and you cannot walk or talk during them 2.  You have a large gush of fluid, or a trickle of fluid that will not stop and you have to wear a pad 3.  You have bleeding that is bright red, heavier than spotting--like menstrual bleeding (spotting can be normal in early labor or after a check of your cervix) 4.  You do not feel the baby moving like he/she normally does 

## 2020-07-23 NOTE — Progress Notes (Signed)
   PRENATAL VISIT NOTE  Subjective:  Becky Mclaughlin is a 20 y.o. G1P0 at [redacted]w[redacted]d being seen today for ongoing prenatal care.  She is currently monitored for the following issues for this low-risk pregnancy and has Supervision of normal first pregnancy, antepartum; Rh negative, antepartum; Sciatic nerve pain, left; Depressive disorder; Gastroesophageal reflux disease without esophagitis; Generalized anxiety disorder with panic attacks; Hypertrophy of vulva; Henoch-Schonlein purpura (HCC); Echogenic intracardiac focus of fetus on prenatal ultrasound; and Pediatric pre-birth visit for expectant parent on their problem list.  Patient reports occasional contractions.  Contractions: Irritability. Vag. Bleeding: None.  Movement: Present. Denies leaking of fluid.   The following portions of the patient's history were reviewed and updated as appropriate: allergies, current medications, past family history, past medical history, past social history, past surgical history and problem list.   Objective:   Vitals:   07/23/20 1455  BP: 126/80  Pulse: 89  Weight: 166 lb 12.8 oz (75.7 kg)    Fetal Status: Fetal Heart Rate (bpm): 136 Fundal Height: 35 cm Movement: Present  Presentation: Complete Breech  General:  Alert, oriented and cooperative. Patient is in no acute distress.  Skin: Skin is warm and dry. No rash noted.   Cardiovascular: Normal heart rate noted  Respiratory: Normal respiratory effort, no problems with respiration noted  Abdomen: Soft, gravid, appropriate for gestational age.  Pain/Pressure: Present     Pelvic: Cervical exam performed in the presence of a chaperone Dilation: 2 Effacement (%): 70 Station: -3  Extremities: Normal range of motion.  Edema: None  Mental Status: Normal mood and affect. Normal behavior. Normal judgment and thought content.   Assessment and Plan:  Pregnancy: G1P0 at [redacted]w[redacted]d 1. Supervision of normal first pregnancy, antepartum --Anticipatory guidance about next  visits/weeks of pregnancy given. --Next visit in 1 week in office  - Fetal nonstress test; Future  2. [redacted] weeks gestation of pregnancy   3. Breech presentation, single or unspecified fetus --Pt doing moxibustion with acupuncturist, and feels significant fetal movement after procedure, but feels like baby is still breech --Leopolds and bedside US confirm breech position with head to maternal left  --Dr Debroah Loop at bedside to answer pt questions about ECV --ECV scheduled tomorrow at 9 am with Dr Alysia Penna   4. Decreased fetal movements in third trimester, single or unspecified fetus --NST reactive with pt feeling more movement in the office today.  Term labor symptoms and general obstetric precautions including but not limited to vaginal bleeding, contractions, leaking of fluid and fetal movement were reviewed in detail with the patient. Please refer to After Visit Summary for other counseling recommendations.   No follow-ups on file.  Future Appointments  Date Time Provider Department Center  07/24/2020  9:00 AM [facility] SCHED ROOM [facility] None    Sharen Counter, CNM

## 2020-07-24 ENCOUNTER — Encounter (HOSPITAL_COMMUNITY): Payer: Self-pay | Admitting: Anesthesiology

## 2020-07-24 ENCOUNTER — Other Ambulatory Visit: Payer: Self-pay

## 2020-07-24 ENCOUNTER — Ambulatory Visit (HOSPITAL_COMMUNITY)
Admission: AD | Admit: 2020-07-24 | Discharge: 2020-07-24 | Disposition: A | Discharge status: Home / Self Care | Payer: Medicaid Other | Attending: Obstetrics and Gynecology | Admitting: Obstetrics and Gynecology

## 2020-07-24 ENCOUNTER — Other Ambulatory Visit: Payer: Self-pay | Admitting: Obstetrics and Gynecology

## 2020-07-24 ENCOUNTER — Encounter (HOSPITAL_COMMUNITY): Payer: Self-pay

## 2020-07-24 ENCOUNTER — Inpatient Hospital Stay (HOSPITAL_COMMUNITY)
Admission: AD | Admit: 2020-07-24 | Payer: Medicaid Other | Source: Home / Self Care | Admitting: Obstetrics & Gynecology

## 2020-07-24 ENCOUNTER — Encounter (HOSPITAL_COMMUNITY): Payer: Self-pay | Admitting: Obstetrics and Gynecology

## 2020-07-24 ENCOUNTER — Ambulatory Visit (HOSPITAL_COMMUNITY): Admission: RE | Admit: 2020-07-24 | Payer: Medicaid Other | Source: Ambulatory Visit

## 2020-07-24 DIAGNOSIS — Z3A37 37 weeks gestation of pregnancy: Secondary | ICD-10-CM | POA: Diagnosis not present

## 2020-07-24 DIAGNOSIS — O26899 Other specified pregnancy related conditions, unspecified trimester: Secondary | ICD-10-CM

## 2020-07-24 DIAGNOSIS — D690 Allergic purpura: Secondary | ICD-10-CM | POA: Insufficient documentation

## 2020-07-24 DIAGNOSIS — K219 Gastro-esophageal reflux disease without esophagitis: Secondary | ICD-10-CM | POA: Insufficient documentation

## 2020-07-24 DIAGNOSIS — O99613 Diseases of the digestive system complicating pregnancy, third trimester: Secondary | ICD-10-CM | POA: Diagnosis not present

## 2020-07-24 DIAGNOSIS — O321XX0 Maternal care for breech presentation, not applicable or unspecified: Secondary | ICD-10-CM | POA: Insufficient documentation

## 2020-07-24 DIAGNOSIS — Z79899 Other long term (current) drug therapy: Secondary | ICD-10-CM | POA: Insufficient documentation

## 2020-07-24 DIAGNOSIS — F411 Generalized anxiety disorder: Secondary | ICD-10-CM | POA: Diagnosis not present

## 2020-07-24 DIAGNOSIS — O99343 Other mental disorders complicating pregnancy, third trimester: Secondary | ICD-10-CM | POA: Diagnosis not present

## 2020-07-24 DIAGNOSIS — F32A Depression, unspecified: Secondary | ICD-10-CM | POA: Diagnosis not present

## 2020-07-24 DIAGNOSIS — M5432 Sciatica, left side: Secondary | ICD-10-CM

## 2020-07-24 DIAGNOSIS — Z6791 Unspecified blood type, Rh negative: Secondary | ICD-10-CM

## 2020-07-24 DIAGNOSIS — O283 Abnormal ultrasonic finding on antenatal screening of mother: Secondary | ICD-10-CM

## 2020-07-24 DIAGNOSIS — O99113 Other diseases of the blood and blood-forming organs and certain disorders involving the immune mechanism complicating pregnancy, third trimester: Secondary | ICD-10-CM | POA: Diagnosis not present

## 2020-07-24 LAB — CBC
HCT: 41 % (ref 36.0–46.0)
Hemoglobin: 13.3 g/dL (ref 12.0–15.0)
MCH: 29.3 pg (ref 26.0–34.0)
MCHC: 32.4 g/dL (ref 30.0–36.0)
MCV: 90.3 fL (ref 80.0–100.0)
Platelets: 294 10*3/uL (ref 150–400)
RBC: 4.54 MIL/uL (ref 3.87–5.11)
RDW: 13.3 % (ref 11.5–15.5)
WBC: 10.1 10*3/uL (ref 4.0–10.5)
nRBC: 0 % (ref 0.0–0.2)

## 2020-07-24 MED ORDER — LACTATED RINGERS IV SOLN: INTRAVENOUS | Status: DC

## 2020-07-24 MED ORDER — TERBUTALINE SULFATE 1 MG/ML IJ SOLN
0.2500 mg | Freq: Once | INTRAMUSCULAR | Status: DC
  Filled 2020-07-24: qty 1

## 2020-07-24 MED ORDER — TERBUTALINE SULFATE 1 MG/ML IJ SOLN
INTRAMUSCULAR | Status: AC
  Filled 2020-07-24: qty 1

## 2020-07-24 MED ORDER — TERBUTALINE SULFATE 1 MG/ML IJ SOLN
0.2500 mg | Freq: Once | INTRAMUSCULAR | Status: AC
  Administered 2020-07-24: 0.25 mg via SUBCUTANEOUS

## 2020-07-24 MED ORDER — LACTATED RINGERS IV SOLN
INTRAVENOUS | Status: DC
  Filled 2020-07-24: qty 1000

## 2020-07-24 NOTE — Discharge Instructions (Signed)
Cesarean Delivery Cesarean birth, or cesarean delivery, is the surgical delivery of a baby through an incision in the abdomen and the uterus. This may be referred to as a C-section. This procedure may be scheduled ahead of time, or it may be done in an emergency situation. Tell a health care provider about:  Any allergies you have.  All medicines you are taking, including vitamins, herbs, eye drops, creams, and over-the-counter medicines.  Any problems you or family members have had with anesthetic medicines.  Any blood disorders you have.  Any surgeries you have had.  Any medical conditions you have.  Whether you or any members of your family have a history of deep vein thrombosis (DVT) or pulmonary embolism (PE). What are the risks? Generally, this is a safe procedure. However, problems may occur, including:  Infection.  Bleeding.  Allergic reactions to medicines.  Damage to other structures or organs.  Blood clots.  Injury to your baby. What happens before the procedure? General instructions  Follow instructions from your health care provider about eating or drinking restrictions.  If you know that you are going to have a cesarean delivery, do not shave your pubic area. Shaving before the procedure may increase your risk of infection.  Plan to have someone take you home from the hospital.  Ask your health care provider what steps will be taken to prevent infection. These may include: ? Removing hair at the surgery site. ? Washing skin with a germ-killing soap. ? Taking antibiotic medicine.  Depending on the reason for your cesarean delivery, you may have a physical exam or additional testing, such as an ultrasound.  You may have your blood or urine tested. Questions for your health care provider  Ask your health care provider about: ? Changing or stopping your regular medicines. This is especially important if you are taking diabetes medicines or blood  thinners. ? Your pain management plan. This is especially important if you plan to breastfeed your baby. ? How long you will be in the hospital after the procedure. ? Any concerns you may have about receiving blood products, if you need them during the procedure. ? Cord blood banking, if you plan to collect your baby's umbilical cord blood.  You may also want to ask your health care provider: ? Whether you will be able to hold or breastfeed your baby while you are still in the operating room. ? Whether your baby can stay with you immediately after the procedure and during your recovery. ? Whether a family member or a person of your choice can go with you into the operating room and stay with you during the procedure, immediately after the procedure, and during your recovery. What happens during the procedure?  An IV will be inserted into one of your veins.  Fluid and medicines, such as antibiotics, will be given before the surgery.  Fetal monitors will be placed on your abdomen to check your baby's heart rate.  You may be given a special warming gown to wear to keep your temperature stable.  A catheter may be inserted into your bladder through your urethra. This drains your urine during the procedure.  You may be given one or more of the following: ? A medicine to numb the area (local anesthetic). ? A medicine to make you fall asleep (general anesthetic). ? A medicine (regional anesthetic) that is injected into your back or through a small thin tube placed in your back (spinal anesthetic or epidural anesthetic). This   numbs everything below the injection site and allows you to stay awake during your procedure. If this makes you feel nauseous, tell your health care provider. Medicines will be available to help reduce any nausea you may feel.  An incision will be made in your abdomen, and then in your uterus.  If you are awake during your procedure, you may feel tugging and pulling in your  abdomen, but you should not feel pain. If you feel pain, tell your health care provider immediately.  Your baby will be removed from your uterus. You may feel more pressure or pushing while this happens.  Immediately after birth, your baby will be dried and kept warm. You may be able to hold and breastfeed your baby.  The umbilical cord may be clamped and cut during this time. This usually occurs after waiting a period of 1-2 minutes after delivery.  Your placenta will be removed from your uterus.  Your incisions will be closed with stitches (sutures). Staples, skin glue, or adhesive strips may also be applied to the incision in your abdomen.  Bandages (dressings) may be placed over the incision in your abdomen. The procedure may vary among health care providers and hospitals.   What happens after the procedure?  Your blood pressure, heart rate, breathing rate, and blood oxygen level will be monitored until you are discharged from the hospital.  You may continue to receive fluids and medicines through an IV.  You will have some pain. Medicines will be available to help control your pain.  To help prevent blood clots: ? You may be given medicines. ? You may have to wear compression stockings or devices. ? You will be encouraged to walk around when you are able.  Hospital staff will encourage and support bonding with your baby. Your hospital may have you and your baby to stay in the same room (rooming in) during your hospital stay to encourage successful bonding and breastfeeding.  You may be encouraged to cough and breathe deeply often. This helps to prevent lung problems.  If you have a catheter draining your urine, it will be removed as soon as possible after your procedure. Summary  Cesarean birth, or cesarean delivery, is the surgical delivery of a baby through an incision in the abdomen and the uterus.  Follow instructions from your health care provider about eating or  drinking restrictions before the procedure.  You will have some pain after the procedure. Medicines will be available to help control your pain.  Hospital staff will encourage and support bonding with your baby after the procedure. Your hospital may have you and your baby to stay in the same room (rooming in) during your hospital stay to encourage successful bonding and breastfeeding. This information is not intended to replace advice given to you by your health care provider. Make sure you discuss any questions you have with your health care provider. Document Revised: 10/07/2019 Document Reviewed: 08/14/2017 Elsevier Patient Education  2021 [employer]. Labor Induction Labor induction is when steps are taken to cause a pregnant woman to begin the labor process. Most women go into labor on their own between 37 weeks and 42 weeks of pregnancy. When this does not happen, or when there is a medical need for labor to begin, steps may be taken to induce, or bring on, labor. Labor induction causes a pregnant woman's uterus to contract. It also causes the cervix to soften (ripen), open (dilate), and thin out. Usually, labor is not induced before  39 weeks of pregnancy unless there is a medical reason to do so. When is labor induction considered? Labor induction may be right for you if:  Your pregnancy lasts longer than 41 to 42 weeks.  Your placenta is separating from your uterus (placental abruption).  You have a rupture of membranes and your labor does not begin.  You have health problems, like diabetes or high blood pressure (preeclampsia) during your pregnancy.  Your baby has stopped growing or does not have enough amniotic fluid. Before labor induction begins, your health care provider will consider the following factors:  Your medical condition and the baby's condition.  How many weeks you have been pregnant.  How mature the baby's lungs are.  The condition of your cervix.  The  position of the baby.  The size of your birth canal. Tell a health care provider about:  Any allergies you have.  All medicines you are taking, including vitamins, herbs, eye drops, creams, and over-the-counter medicines.  Any problems you or your family members have had with anesthetic medicines.  Any surgeries you have had.  Any blood disorders you have.  Any medical conditions you have. What are the risks? Generally, this is a safe procedure. However, problems may occur, including:  Failed induction.  Changes in fetal heart rate, such as being too high, too low, or irregular (erratic).  Infection in the mother or the baby.  Increased risk of having a cesarean delivery.  Breaking off (abruption) of the placenta from the uterus. This is rare.  Rupture of the uterus. This is very rare.  Your baby could fail to get enough blood flow or oxygen. This can be life-threatening. When induction is needed for medical reasons, the benefits generally outweigh the risks. What happens during the procedure? During the procedure, your health care provider will use one of these methods to induce labor:  Stripping the membranes. In this method, the amniotic sac tissue is gently separated from the cervix. This causes the following to happen: ? Your cervix stretches, which in turn causes the release of prostaglandins. ? Prostaglandins induce labor and cause the uterus to contract. ? This procedure is often done in an office visit. You will be sent home to wait for contractions to begin.  Prostaglandin medicine. This medicine starts contractions and causes the cervix to dilate and ripen. This can be taken by mouth (orally) or by being inserted into the vagina (suppository).  Inserting a small, thin tube (catheter) with a balloon into the vagina and then expanding the balloon with water to dilate the cervix.  Breaking the water. In this method, a small instrument is used to make a small hole  in the amniotic sac. This eventually causes the amniotic sac to break. Contractions should begin within a few hours.  Medicine to trigger or strengthen contractions. This medicine is given through an IV that is inserted into a vein in your arm. This procedure may vary among health care providers and hospitals.   Where to find more information  [employer]: www.marchofdimes.org  [employer]: www.acog.org Summary  Labor induction causes a pregnant woman's uterus to contract. It also causes the cervix to soften (ripen), open (dilate), and thin out.  Labor is usually not induced before 39 weeks of pregnancy unless there is a medical reason to do so.  When induction is needed for medical reasons, the benefits generally outweigh the risks.  Talk with your health care provider about which  methods of labor induction are right for you. This information is not intended to replace advice given to you by your health care provider. Make sure you discuss any questions you have with your health care provider. Document Revised: 11/21/2019 Document Reviewed: 11/21/2019 Elsevier Patient Education  2021 Else[employer] Inc. Fetal Movement Counts Patient Name: ________________________________________________ Patient Due Date: ____________________  What is a fetal movement count? A fetal movement count is the number of times that you feel your baby move during a certain amount of time. This may also be called a fetal kick count. A fetal movement count is recommended for every pregnant woman. You may be asked to start counting fetal movements as early as week 28 of your pregnancy. Pay attention to when your baby is most active. You may notice your baby's sleep and wake cycles. You may also notice things that make your baby move more. You should do a fetal movement count:  When your baby is normally most active.  At the same time each day. A good time to count  movements is while you are resting, after having something to eat and drink. How do I count fetal movements? 1. Find a quiet, comfortable area. Sit, or lie down on your side. 2. Write down the date, the start time and stop time, and the number of movements that you felt between those two times. Take this information with you to your health care visits. 3. Write down your start time when you feel the first movement. 4. Count kicks, flutters, swishes, rolls, and jabs. You should feel at least 10 movements. 5. You may stop counting after you have felt 10 movements, or if you have been counting for 2 hours. Write down the stop time. 6. If you do not feel 10 movements in 2 hours, contact your health care provider for further instructions. Your health care provider may want to do additional tests to assess your baby's well-being. Contact a health care provider if:  You feel fewer than 10 movements in 2 hours.  Your baby is not moving like he or she usually does. Date: ____________ Start time: ____________ Stop time: ____________ Movements: ____________ Date: ____________ Start time: ____________ Stop time: ____________ Movements: ____________ Date: ____________ Start time: ____________ Stop time: ____________ Movements: ____________ Date: ____________ Start time: ____________ Stop time: ____________ Movements: ____________ Date: ____________ Start time: ____________ Stop time: ____________ Movements: ____________ Date: ____________ Start time: ____________ Stop time: ____________ Movements: ____________ Date: ____________ Start time: ____________ Stop time: ____________ Movements: ____________ Date: ____________ Start time: ____________ Stop time: ____________ Movements: ____________ Date: ____________ Start time: ____________ Stop time: ____________ Movements: ____________ This information is not intended to replace advice given to you by your health care provider. Make sure you discuss any  questions you have with your health care provider. Document Revised: 09/27/2018 Document Reviewed: 09/27/2018 Elsevier Patient Education  2021 [employer].

## 2020-07-24 NOTE — Discharge Summary (Signed)
Physician Discharge Summary  Patient ID: Becky Mclaughlin MRN: [id number] DOB/AGE: 2000-12-24 20 y.o.  Admit date: 07/24/2020 Discharge date: 07/24/2020  Admission Diagnoses: Breech Fetal Presentation  Discharge Diagnoses:  Active Problems:   Breech presentation, single or unspecified fetus  Discharged Condition: good  Hospital Course:  [name] is a 20 y.o. female G1P0 with IUP at 37w5dby LMP who presented on 07/24/20 for ECV in the setting of breech fetal presentation. Pt received terbutaline prior to the procedure. Appropriate fetal heart tracing before and after the procedure. Pt and baby tolerated the procedure well, but ultimately unable to rotate baby to cephalic presentation s/p 4 attempts. Of note, pt was noted to be in transverse position (fetal head to maternal left) s/p the procedure. Discharge home with close follow-up in clinic for next prenatal visit on 07/30/20. Provided strict return precautions for vaginal bleeding, loss of fluid, contractions, decreased fetal movement and other concerns prior to discharge.  Consults: None  Significant Diagnostic Studies: None  Treatments: terbutaline prior to ECV  Discharge Exam: Last menstrual period 10/06/2019. General appearance: alert, cooperative and appears stated age Head: Normocephalic, without obvious abnormality, atraumatic Neck: supple Resp: normal WOB Cardio: regular rate Extremities: extremities normal, atraumatic, no cyanosis or edema Neurologic: Grossly normal  Disposition: Discharge disposition: 01-Home or Self Care  Allergies as of 07/24/2020      Reactions   Fluconazole Other (See Comments)   Syncopal episode and , hot flashes and feeling lightheaded, jittery      Medication List    TAKE these medications   amphetamine-dextroamphetamine 20 MG tablet Commonly known as: ADDERALL Take 20 mg by mouth 2 (two) times daily.   Blood Pressure Kit Devi 1 kit by Does not apply route as needed.    Doxylamine-Pyridoxine 10-10 MG Tbec Commonly known as: Diclegis Take 2 tablets by mouth at bedtime. If symptoms persist, add one tablet in the morning and one in the afternoon   famotidine 10 MG tablet Commonly known as: PEPCID Take 10 mg by mouth 2 (two) times daily.   hydrOXYzine 25 MG capsule Commonly known as: VISTARIL Take 1 capsule (25 mg total) by mouth daily as needed.   hydrOXYzine 25 MG tablet Commonly known as: ATARAX/VISTARIL hydroxyzine HCl 25 mg tablet   multivitamin-prenatal 27-0.8 MG Tabs tablet Take 1 tablet by mouth daily at 12 noon.   omeprazole 40 MG capsule Commonly known as: PRILOSEC   sertraline 50 MG tablet Commonly known as: ZOLOFT Take by mouth.      Signed: ARanda Ngo6/04/2020, 1:01 PM

## 2020-07-24 NOTE — H&P (Signed)
OBSTETRIC ADMISSION HISTORY AND PHYSICAL  Becky Mclaughlin is a 20 y.o. female G1P0 with IUP at 78w5dby LMP presenting for ECV in the setting of breech fetal presentation. She reports +FMs, No LOF, no VB, no blurry vision, headaches or peripheral edema, and RUQ pain.  She plans on breast feeding. She is considering depo for birth control. She received her [facility] --->  Estimated Date of Delivery: 08/09/20  Sono:  _0 , CWD, normal anatomy, cephalic presentation,  6542H 60% EFW  Prenatal History/Complications:  - Rh negative - Anxiety/Depression - Henoch-Schonlein purpura - Fetal echogenic intracardiac focus - Breech fetal presentation  Past Medical History: Past Medical History:  Diagnosis Date  . Acid reflux     Past Surgical History: Past Surgical History:  Procedure Laterality Date  . MOUTH SURGERY  2018    Obstetrical History: OB History    Gravida  1   Para      Term      Preterm      AB      Living  0     SAB      IAB      Ectopic      Multiple      Live Births              Social History Social History   Socioeconomic History  . Marital status: Single    Spouse name: Not on file  . Number of children: 0  . Years of education: Not on file  . Highest education level: Not on file  Occupational History  . Not on file  Tobacco Use  . Smoking status: Never Smoker  . Smokeless tobacco: Never Used  Vaping Use  . Vaping Use: Never used  Substance and Sexual Activity  . Alcohol use: Not Currently    Comment: occasional  . Drug use: Never  . Sexual activity: Yes  Other Topics Concern  . Not on file  Social History Narrative  . Not on file   Social Determinants of Health   Financial Resource Strain: Not on file  Food Insecurity: Not on file  Transportation Needs: Not on file  Physical Activity: Not on file  Stress: Not on file  Social Connections: Not on file    Family History: Family History   Problem Relation Age of Onset  . Hypertension Mother        "had hole in her heart"  . Hypertension Father   . Stroke Cousin        At age 22, used to vape    Allergies: Allergies  Allergen Reactions  . Fluconazole Other (See Comments)    Syncopal episode and , hot flashes and feeling lightheaded, jittery    Medications Prior to Admission  Medication Sig Dispense Refill Last Dose  . amphetamine-dextroamphetamine (ADDERALL) 20 MG tablet Take 20 mg by mouth 2 (two) times daily. (Patient not taking: No sig reported)     . Blood Pressure Monitoring (BLOOD PRESSURE KIT) DEVI 1 kit by Does not apply route as needed. 1 each 0   . Doxylamine-Pyridoxine (DICLEGIS) 10-10 MG TBEC Take 2 tablets by mouth at bedtime. If symptoms persist, add one tablet in the morning and one in the afternoon (Patient not taking: No sig reported) 100 tablet 5   . famotidine (PEPCID) 10 MG tablet Take 10 mg by mouth 2 (two) times daily. (Patient not taking: No sig reported)     . hydrOXYzine (  ATARAX/VISTARIL) 25 MG tablet hydroxyzine HCl 25 mg tablet     . hydrOXYzine (VISTARIL) 25 MG capsule Take 1 capsule (25 mg total) by mouth daily as needed. 30 capsule 0   . omeprazole (PRILOSEC) 40 MG capsule  (Patient not taking: No sig reported)     . Prenatal Vit-Fe Fumarate-FA (MULTIVITAMIN-PRENATAL) 27-0.8 MG TABS tablet Take 1 tablet by mouth daily at 12 noon.     . sertraline (ZOLOFT) 50 MG tablet Take by mouth.      Review of Systems   All systems reviewed and negative except as stated in HPI  Last menstrual period 10/06/2019. General appearance: alert, cooperative and appears stated age Lungs: normal WOB Heart: regular rate  Abdomen: soft, non-tender Extremities:  no sign of DVT  Presentation: frank breech Fetal monitoring: FHT wnl prior to procedure   Prenatal labs: ABO, Rh: O/Negative/-- (11/22 1106) Antibody: Negative (11/22 1106) Rubella: 8.64 (11/22 1106) RPR: Non Reactive (04/08 1027)  HBsAg:  Negative (11/22 1106)  HIV: Non Reactive (04/08 1027)  GBS: Negative/-- (05/27 1136)  2 hr Glucola wnl Genetic screening wnl Anatomy US wnl except for echogenic intracardiac focus  Prenatal Transfer Tool  Maternal Diabetes: No Genetic Screening: Normal Maternal Ultrasounds/Referrals: Normal Fetal Ultrasounds or other Referrals:  None Maternal Substance Abuse:  No Significant Maternal Medications:  None Significant Maternal Lab Results: Group B Strep negative  No results found for this or any previous visit (from the past 24 hour(s)).  Patient Active Problem List   Diagnosis Date Noted  . Breech presentation, no version 07/23/2020  . Pediatric pre-birth visit for expectant parent 06/22/2020  . Echogenic intracardiac focus of fetus on prenatal ultrasound 03/17/2020  . Rh negative, antepartum 02/08/2020  . Sciatic nerve pain, left 02/08/2020  . Supervision of normal first pregnancy, antepartum 12/16/2019  . Depressive disorder 02/13/2019  . 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 Generalized anxiety disorder with panic attacks 11/29/2018  . Gastroesophageal reflux disease without esophagitis 05/09/2018  . 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 G1P0 at 81w5dhere for ECV in the setting of breech presentation.  #ECV: After informed verbal consent, Terbutaline 0.25 mg SQ given, ECV was attempted under Ultrasound guidance without successful s/p 4 attempts. FHR was reactive before and after the procedure.   Patient and baby tolerated procedure well.  GRanda Ngo MD OB Fellow, [facility] 07/24/2020 9:35 AM

## 2020-07-30 ENCOUNTER — Ambulatory Visit (INDEPENDENT_AMBULATORY_CARE_PROVIDER_SITE_OTHER): Payer: Medicaid Other | Admitting: Family Medicine

## 2020-07-30 ENCOUNTER — Encounter: Payer: Self-pay | Admitting: *Deleted

## 2020-07-30 ENCOUNTER — Other Ambulatory Visit: Payer: Self-pay

## 2020-07-30 VITALS — BP 119/8 | HR 88 | Wt 164.0 lb

## 2020-07-30 DIAGNOSIS — Z6791 Unspecified blood type, Rh negative: Secondary | ICD-10-CM

## 2020-07-30 DIAGNOSIS — O283 Abnormal ultrasonic finding on antenatal screening of mother: Secondary | ICD-10-CM

## 2020-07-30 DIAGNOSIS — M5432 Sciatica, left side: Secondary | ICD-10-CM

## 2020-07-30 DIAGNOSIS — O26899 Other specified pregnancy related conditions, unspecified trimester: Secondary | ICD-10-CM

## 2020-07-30 DIAGNOSIS — Z3400 Encounter for supervision of normal first pregnancy, unspecified trimester: Secondary | ICD-10-CM

## 2020-07-30 NOTE — Progress Notes (Signed)
Pt would like to know position of baby- had version last week.

## 2020-07-30 NOTE — Progress Notes (Signed)
   PRENATAL VISIT NOTE  Subjective:  Becky Mclaughlin is a 20 y.o. G1P0 at [redacted]w[redacted]d being seen today for ongoing prenatal care.  She is currently monitored for the following issues for this low-risk pregnancy and has Supervision of normal first pregnancy, antepartum; Rh negative, antepartum; Sciatic nerve pain, left; Depressive disorder; Gastroesophageal reflux disease without esophagitis; Generalized anxiety disorder with panic attacks; Hypertrophy of vulva; Henoch-Schonlein purpura (HCC); Echogenic intracardiac focus of fetus on prenatal ultrasound; Pediatric pre-birth visit for expectant parent; Breech presentation, no version; Breech presentation, single or unspecified fetus; Cesarean delivery delivered; and Status post primary low transverse cesarean section on their problem list.  Patient reports no complaints.  Contractions: Irritability. Vag. Bleeding: None.  Movement: Present. Denies leaking of fluid.   The following portions of the patient's history were reviewed and updated as appropriate: allergies, current medications, past family history, past medical history, past social history, past surgical history and problem list.   Objective:   Vitals:   07/30/20 1531  BP: (!) 119/8  Pulse: 88  Weight: 164 lb (74.4 kg)    Fetal Status: Fetal Heart Rate (bpm): 130   Movement: Present     General:  Alert, oriented and cooperative. Patient is in no acute distress.  Skin: Skin is warm and dry. No rash noted.   Cardiovascular: Normal heart rate noted  Respiratory: Normal respiratory effort, no problems with respiration noted  Abdomen: Soft, gravid, appropriate for gestational age.  Pain/Pressure: Present     Pelvic: Cervical exam deferred        Extremities: Normal range of motion.     Mental Status: Normal mood and affect. Normal behavior. Normal judgment and thought content.   Assessment and Plan:  Pregnancy: G1P0 at [redacted]w[redacted]d 1. Rh negative, antepartum Need rhogam  work up pp  2. Sciatic  nerve pain, left stable  3. Echogenic intracardiac focus of fetus on prenatal ultrasound  4. Supervision of normal first pregnancy, antepartum Infant is still breech on [place] today Discussed CS at 39 weeks vs attempt at ECV with regional Patient desires CS Sent message to Scherrie Gerlach today Patient aware of need for pap smear at [facility] visit  Term labor symptoms and general obstetric precautions including but not limited to vaginal bleeding, contractions, leaking of fluid and fetal movement were reviewed in detail with the patient. Please refer to After Visit Summary for other counseling recommendations.   No follow-ups on file.  Future Appointments  Date Time Provider Department Center  08/07/2020 10:40 AM [facility] NURSE [facility] None  08/17/2020  1:00 PM Gwyndolyn Saxon, LCSW [facility] None  09/14/2020  1:00 PM Leftwich-Kirby, Wilmer Floor, CNM [facility] None    Federico Flake, MD

## 2020-07-31 ENCOUNTER — Encounter (HOSPITAL_COMMUNITY): Payer: Self-pay

## 2020-07-31 ENCOUNTER — Inpatient Hospital Stay (HOSPITAL_COMMUNITY): Payer: Medicaid Other

## 2020-07-31 ENCOUNTER — Encounter (HOSPITAL_COMMUNITY)
Admission: AD | Disposition: A | Discharge status: Home / Self Care | Payer: Self-pay | Source: Home / Self Care | Attending: Obstetrics and Gynecology

## 2020-07-31 ENCOUNTER — Encounter (HOSPITAL_COMMUNITY)
Admission: RE | Admit: 2020-07-31 | Discharge: 2020-07-31 | Disposition: A | Discharge status: Home / Self Care | Payer: Medicaid Other | Source: Ambulatory Visit | Attending: Obstetrics and Gynecology | Admitting: Obstetrics and Gynecology

## 2020-07-31 ENCOUNTER — Inpatient Hospital Stay (HOSPITAL_COMMUNITY)
Admission: AD | Admit: 2020-07-31 | Discharge: 2020-08-02 | DRG: 788 | Disposition: A | Discharge status: Home / Self Care | Payer: Medicaid Other | Attending: Obstetrics and Gynecology | Admitting: Obstetrics and Gynecology

## 2020-07-31 ENCOUNTER — Encounter (HOSPITAL_COMMUNITY): Payer: Self-pay | Admitting: Obstetrics and Gynecology

## 2020-07-31 ENCOUNTER — Other Ambulatory Visit (HOSPITAL_COMMUNITY): Payer: Medicaid Other

## 2020-07-31 ENCOUNTER — Telehealth: Payer: Self-pay | Admitting: *Deleted

## 2020-07-31 DIAGNOSIS — Z20822 Contact with and (suspected) exposure to covid-19: Secondary | ICD-10-CM | POA: Diagnosis present

## 2020-07-31 DIAGNOSIS — Z98891 History of uterine scar from previous surgery: Secondary | ICD-10-CM

## 2020-07-31 DIAGNOSIS — F329 Major depressive disorder, single episode, unspecified: Secondary | ICD-10-CM | POA: Diagnosis present

## 2020-07-31 DIAGNOSIS — F411 Generalized anxiety disorder: Secondary | ICD-10-CM | POA: Diagnosis present

## 2020-07-31 DIAGNOSIS — M5432 Sciatica, left side: Secondary | ICD-10-CM

## 2020-07-31 DIAGNOSIS — F410 Panic disorder [episodic paroxysmal anxiety] without agoraphobia: Secondary | ICD-10-CM | POA: Diagnosis present

## 2020-07-31 DIAGNOSIS — O283 Abnormal ultrasonic finding on antenatal screening of mother: Secondary | ICD-10-CM

## 2020-07-31 DIAGNOSIS — O321XX0 Maternal care for breech presentation, not applicable or unspecified: Principal | ICD-10-CM | POA: Diagnosis present

## 2020-07-31 DIAGNOSIS — Z3400 Encounter for supervision of normal first pregnancy, unspecified trimester: Secondary | ICD-10-CM

## 2020-07-31 DIAGNOSIS — O26893 Other specified pregnancy related conditions, third trimester: Secondary | ICD-10-CM | POA: Diagnosis present

## 2020-07-31 DIAGNOSIS — Z3A38 38 weeks gestation of pregnancy: Secondary | ICD-10-CM | POA: Diagnosis not present

## 2020-07-31 DIAGNOSIS — Z6791 Unspecified blood type, Rh negative: Secondary | ICD-10-CM | POA: Diagnosis not present

## 2020-07-31 DIAGNOSIS — O4202 Full-term premature rupture of membranes, onset of labor within 24 hours of rupture: Secondary | ICD-10-CM | POA: Diagnosis not present

## 2020-07-31 DIAGNOSIS — F32A Depression, unspecified: Secondary | ICD-10-CM | POA: Diagnosis present

## 2020-07-31 DIAGNOSIS — O26899 Other specified pregnancy related conditions, unspecified trimester: Secondary | ICD-10-CM

## 2020-07-31 HISTORY — DX: Anxiety disorder, unspecified: F41.9

## 2020-07-31 HISTORY — DX: Disorder of kidney and ureter, unspecified: N28.9

## 2020-07-31 LAB — CBC
HCT: 41.4 % (ref 36.0–46.0)
Hemoglobin: 13.7 g/dL (ref 12.0–15.0)
MCH: 29.7 pg (ref 26.0–34.0)
MCHC: 33.1 g/dL (ref 30.0–36.0)
MCV: 89.8 fL (ref 80.0–100.0)
Platelets: 303 10*3/uL (ref 150–400)
RBC: 4.61 MIL/uL (ref 3.87–5.11)
RDW: 13.6 % (ref 11.5–15.5)
WBC: 11.3 10*3/uL — ABNORMAL HIGH (ref 4.0–10.5)
nRBC: 0 % (ref 0.0–0.2)

## 2020-07-31 LAB — POCT FERN TEST: POCT Fern Test: POSITIVE

## 2020-07-31 LAB — RESP PANEL BY RT-PCR (FLU A&B, COVID) ARPGX2
Influenza A by PCR: NEGATIVE
Influenza B by PCR: NEGATIVE
SARS Coronavirus 2 by RT PCR: NEGATIVE

## 2020-07-31 LAB — TYPE AND SCREEN
ABO/RH(D): O NEG
Antibody Screen: POSITIVE

## 2020-07-31 SURGERY — Surgical Case
Anesthesia: Spinal

## 2020-07-31 MED ORDER — IBUPROFEN 600 MG PO TABS: 600.0000 mg | ORAL_TABLET | Freq: Four times a day (QID) | ORAL | Status: DC | PRN

## 2020-07-31 MED ORDER — WITCH HAZEL-GLYCERIN EX PADS
1.0000 | MEDICATED_PAD | CUTANEOUS | Status: DC | PRN
Start: 2020-07-31 — End: 2020-08-02

## 2020-07-31 MED ORDER — SODIUM CHLORIDE 0.9% FLUSH: 3.0000 mL | INTRAVENOUS | Status: DC | PRN

## 2020-07-31 MED ORDER — NALOXONE HCL 0.4 MG/ML IJ SOLN: 0.4000 mg | INTRAMUSCULAR | Status: DC | PRN

## 2020-07-31 MED ORDER — HYDROMORPHONE HCL 1 MG/ML IJ SOLN: 0.2500 mg | INTRAMUSCULAR | Status: DC | PRN

## 2020-07-31 MED ORDER — SENNOSIDES-DOCUSATE SODIUM 8.6-50 MG PO TABS
2.0000 | ORAL_TABLET | ORAL | Status: DC
  Administered 2020-08-01 – 2020-08-02 (×2): 2 via ORAL
  Administered 2020-08-02: 1 via ORAL
  Filled 2020-07-31 (×3): qty 2

## 2020-07-31 MED ORDER — TETANUS-DIPHTH-ACELL PERTUSSIS 5-2.5-18.5 LF-MCG/0.5 IM SUSY: 0.5000 mL | PREFILLED_SYRINGE | Freq: Once | INTRAMUSCULAR | Status: DC

## 2020-07-31 MED ORDER — FENTANYL CITRATE (PF) 100 MCG/2ML IJ SOLN
INTRAMUSCULAR | Status: AC
  Filled 2020-07-31: qty 2

## 2020-07-31 MED ORDER — NALBUPHINE HCL 10 MG/ML IJ SOLN: 5.0000 mg | Freq: Once | INTRAMUSCULAR | Status: DC | PRN

## 2020-07-31 MED ORDER — SIMETHICONE 80 MG PO CHEW
80.0000 mg | CHEWABLE_TABLET | Freq: Three times a day (TID) | ORAL | Status: DC
  Administered 2020-08-01 – 2020-08-02 (×4): 80 mg via ORAL
  Filled 2020-07-31 (×4): qty 1

## 2020-07-31 MED ORDER — ZOLPIDEM TARTRATE 5 MG PO TABS
5.0000 mg | ORAL_TABLET | Freq: Every evening | ORAL | Status: DC | PRN
Start: 2020-07-31 — End: 2020-08-02

## 2020-07-31 MED ORDER — LACTATED RINGERS IV SOLN: INTRAVENOUS | Status: DC

## 2020-07-31 MED ORDER — AMOXICILLIN 500 MG PO CAPS
500.0000 mg | ORAL_CAPSULE | Freq: Three times a day (TID) | ORAL | Status: DC
  Administered 2020-07-31 – 2020-08-02 (×5): 500 mg via ORAL
  Filled 2020-07-31 (×7): qty 1

## 2020-07-31 MED ORDER — ONDANSETRON HCL 4 MG/2ML IJ SOLN
INTRAMUSCULAR | Status: DC | PRN
  Administered 2020-07-31: 4 mg via INTRAVENOUS

## 2020-07-31 MED ORDER — OXYTOCIN-SODIUM CHLORIDE 30-0.9 UT/500ML-% IV SOLN
2.5000 [IU]/h | INTRAVENOUS | Status: AC
  Administered 2020-07-31 (×2): 2.5 [IU]/h via INTRAVENOUS
  Filled 2020-07-31: qty 500

## 2020-07-31 MED ORDER — OXYTOCIN-SODIUM CHLORIDE 30-0.9 UT/500ML-% IV SOLN
INTRAVENOUS | Status: AC
  Filled 2020-07-31: qty 500

## 2020-07-31 MED ORDER — BUPIVACAINE IN DEXTROSE 0.75-8.25 % IT SOLN: INTRATHECAL | Status: DC | PRN

## 2020-07-31 MED ORDER — ACETAMINOPHEN 325 MG PO TABS
650.0000 mg | ORAL_TABLET | ORAL | Status: DC | PRN
  Administered 2020-08-01: 650 mg via ORAL
  Filled 2020-07-31: qty 2

## 2020-07-31 MED ORDER — MEPERIDINE HCL 25 MG/ML IJ SOLN: 6.2500 mg | INTRAMUSCULAR | Status: DC | PRN

## 2020-07-31 MED ORDER — BUPIVACAINE IN DEXTROSE 0.75-8.25 % IT SOLN
INTRATHECAL | Status: DC | PRN
  Administered 2020-07-31: 1.6 mL via INTRATHECAL

## 2020-07-31 MED ORDER — MENTHOL 3 MG MT LOZG: 1.0000 | LOZENGE | OROMUCOSAL | Status: DC | PRN

## 2020-07-31 MED ORDER — DIPHENHYDRAMINE HCL 50 MG/ML IJ SOLN: 12.5000 mg | INTRAMUSCULAR | Status: DC | PRN

## 2020-07-31 MED ORDER — NALBUPHINE HCL 10 MG/ML IJ SOLN
5.0000 mg | INTRAMUSCULAR | Status: DC | PRN
  Administered 2020-08-01: 5 mg via INTRAVENOUS
  Filled 2020-07-31: qty 1

## 2020-07-31 MED ORDER — OXYTOCIN-SODIUM CHLORIDE 30-0.9 UT/500ML-% IV SOLN
INTRAVENOUS | Status: DC | PRN
  Administered 2020-07-31: 300 mL via INTRAVENOUS

## 2020-07-31 MED ORDER — KETOROLAC TROMETHAMINE 30 MG/ML IJ SOLN
30.0000 mg | Freq: Once | INTRAMUSCULAR | Status: AC | PRN
  Administered 2020-07-31: 30 mg via INTRAVENOUS

## 2020-07-31 MED ORDER — MORPHINE SULFATE (PF) 0.5 MG/ML IJ SOLN
INTRAMUSCULAR | Status: DC | PRN
  Administered 2020-07-31: 150 ug via EPIDURAL

## 2020-07-31 MED ORDER — SOD CITRATE-CITRIC ACID 500-334 MG/5ML PO SOLN
30.0000 mL | ORAL | Status: AC
  Administered 2020-07-31: 30 mL via ORAL
  Filled 2020-07-31: qty 15

## 2020-07-31 MED ORDER — KETOROLAC TROMETHAMINE 30 MG/ML IJ SOLN: 30.0000 mg | Freq: Four times a day (QID) | INTRAMUSCULAR | Status: DC | PRN

## 2020-07-31 MED ORDER — ONDANSETRON HCL 4 MG/2ML IJ SOLN: 4.0000 mg | Freq: Three times a day (TID) | INTRAMUSCULAR | Status: DC | PRN

## 2020-07-31 MED ORDER — DIBUCAINE (PERIANAL) 1 % EX OINT: 1.0000 "application " | TOPICAL_OINTMENT | CUTANEOUS | Status: DC | PRN

## 2020-07-31 MED ORDER — MEDROXYPROGESTERONE ACETATE 150 MG/ML IM SUSP: 150.0000 mg | Freq: Once | INTRAMUSCULAR | Status: DC

## 2020-07-31 MED ORDER — DIPHENHYDRAMINE HCL 25 MG PO CAPS
25.0000 mg | ORAL_CAPSULE | Freq: Four times a day (QID) | ORAL | Status: DC | PRN
  Administered 2020-07-31: 25 mg via ORAL

## 2020-07-31 MED ORDER — PROMETHAZINE HCL 25 MG/ML IJ SOLN: 6.2500 mg | INTRAMUSCULAR | Status: DC | PRN

## 2020-07-31 MED ORDER — TERBUTALINE SULFATE 1 MG/ML IJ SOLN
0.2500 mg | Freq: Once | INTRAMUSCULAR | Status: AC
  Administered 2020-07-31: 0.25 mg via SUBCUTANEOUS

## 2020-07-31 MED ORDER — SODIUM CHLORIDE 0.9 % IR SOLN
Status: DC | PRN
  Administered 2020-07-31: 1

## 2020-07-31 MED ORDER — NALBUPHINE HCL 10 MG/ML IJ SOLN: 5.0000 mg | INTRAMUSCULAR | Status: DC | PRN

## 2020-07-31 MED ORDER — COCONUT OIL OIL
1.0000 "application " | TOPICAL_OIL | Status: DC | PRN
  Administered 2020-08-01: 1 via TOPICAL

## 2020-07-31 MED ORDER — MORPHINE SULFATE (PF) 0.5 MG/ML IJ SOLN
INTRAMUSCULAR | Status: AC
  Filled 2020-07-31: qty 10

## 2020-07-31 MED ORDER — FENTANYL CITRATE (PF) 100 MCG/2ML IJ SOLN
INTRAMUSCULAR | Status: DC | PRN
  Administered 2020-07-31: 15 ug via INTRAVENOUS

## 2020-07-31 MED ORDER — PHENYLEPHRINE HCL-NACL 20-0.9 MG/250ML-% IV SOLN
INTRAVENOUS | Status: DC | PRN
  Administered 2020-07-31: 60 ug/min via INTRAVENOUS

## 2020-07-31 MED ORDER — DIPHENHYDRAMINE HCL 25 MG PO CAPS
25.0000 mg | ORAL_CAPSULE | ORAL | Status: DC | PRN
  Filled 2020-07-31: qty 1

## 2020-07-31 MED ORDER — KETOROLAC TROMETHAMINE 30 MG/ML IJ SOLN
INTRAMUSCULAR | Status: AC
  Filled 2020-07-31: qty 1

## 2020-07-31 MED ORDER — PRENATAL MULTIVITAMIN CH
1.0000 | ORAL_TABLET | Freq: Every day | ORAL | Status: DC
  Administered 2020-08-01 – 2020-08-02 (×2): 1 via ORAL
  Filled 2020-07-31 (×2): qty 1

## 2020-07-31 MED ORDER — NALOXONE HCL 4 MG/10ML IJ SOLN
1.0000 ug/kg/h | INTRAVENOUS | Status: DC | PRN
  Filled 2020-07-31: qty 5

## 2020-07-31 MED ORDER — SIMETHICONE 80 MG PO CHEW
80.0000 mg | CHEWABLE_TABLET | ORAL | Status: DC | PRN
Start: 2020-07-31 — End: 2020-08-02

## 2020-07-31 MED ORDER — ACETAMINOPHEN 500 MG PO TABS
1000.0000 mg | ORAL_TABLET | Freq: Four times a day (QID) | ORAL | Status: AC
  Administered 2020-07-31 – 2020-08-01 (×3): 1000 mg via ORAL
  Filled 2020-07-31 (×3): qty 2

## 2020-07-31 MED ORDER — OXYCODONE HCL 5 MG PO TABS: 5.0000 mg | ORAL_TABLET | ORAL | Status: DC | PRN

## 2020-07-31 MED ORDER — SCOPOLAMINE 1 MG/3DAYS TD PT72: 1.0000 | MEDICATED_PATCH | Freq: Once | TRANSDERMAL | Status: DC

## 2020-07-31 MED ORDER — CEFAZOLIN SODIUM-DEXTROSE 2-4 GM/100ML-% IV SOLN
2.0000 g | INTRAVENOUS | Status: AC
  Administered 2020-07-31: 2 g via INTRAVENOUS
  Filled 2020-07-31: qty 100

## 2020-07-31 MED ORDER — TERBUTALINE SULFATE 1 MG/ML IJ SOLN
INTRAMUSCULAR | Status: AC
  Filled 2020-07-31: qty 1

## 2020-07-31 MED ORDER — LACTATED RINGERS IV BOLUS
1000.0000 mL | Freq: Once | INTRAVENOUS | Status: AC
  Administered 2020-07-31: 1000 mL via INTRAVENOUS

## 2020-07-31 SURGICAL SUPPLY — 33 items
BENZOIN TINCTURE PRP APPL 2/3 (GAUZE/BANDAGES/DRESSINGS) ×2 IMPLANT
CHLORAPREP W/TINT 26ML (MISCELLANEOUS) ×2 IMPLANT
CLAMP CORD UMBIL (MISCELLANEOUS) IMPLANT
CLOSURE STERI STRIP 1/2 X4 (GAUZE/BANDAGES/DRESSINGS) ×2 IMPLANT
CLOTH BEACON ORANGE TIMEOUT ST (SAFETY) ×2 IMPLANT
DRSG OPSITE POSTOP 4X10 (GAUZE/BANDAGES/DRESSINGS) ×2 IMPLANT
ELECT REM PT RETURN 9FT ADLT (ELECTROSURGICAL) ×2
ELECTRODE REM PT RTRN 9FT ADLT (ELECTROSURGICAL) ×1 IMPLANT
EXTRACTOR VACUUM M CUP 4 TUBE (SUCTIONS) IMPLANT
GLOVE BIOGEL PI IND STRL 7.0 (GLOVE) ×2 IMPLANT
GLOVE BIOGEL PI IND STRL 7.5 (GLOVE) ×2 IMPLANT
GLOVE BIOGEL PI INDICATOR 7.0 (GLOVE) ×2
GLOVE BIOGEL PI INDICATOR 7.5 (GLOVE) ×2
GLOVE ECLIPSE 7.5 STRL STRAW (GLOVE) ×2 IMPLANT
GOWN STRL REUS W/TWL LRG LVL3 (GOWN DISPOSABLE) ×6 IMPLANT
KIT ABG SYR 3ML LUER SLIP (SYRINGE) IMPLANT
NEEDLE HYPO 25X5/8 SAFETYGLIDE (NEEDLE) IMPLANT
NS IRRIG 1000ML POUR BTL (IV SOLUTION) ×2 IMPLANT
PACK C SECTION WH (CUSTOM PROCEDURE TRAY) ×2 IMPLANT
PAD OB MATERNITY 4.3X12.25 (PERSONAL CARE ITEMS) ×2 IMPLANT
PENCIL SMOKE EVAC W/HOLSTER (ELECTROSURGICAL) ×2 IMPLANT
RTRCTR C-SECT PINK 25CM LRG (MISCELLANEOUS) ×2 IMPLANT
STRIP CLOSURE SKIN 1/2X4 (GAUZE/BANDAGES/DRESSINGS) ×2 IMPLANT
SUT PLAIN 2 0 XLH (SUTURE) ×2 IMPLANT
SUT VIC AB 0 CT1 36 (SUTURE) ×2 IMPLANT
SUT VIC AB 0 CTX 36 (SUTURE) ×2
SUT VIC AB 0 CTX36XBRD ANBCTRL (SUTURE) ×2 IMPLANT
SUT VIC AB 2-0 CT1 27 (SUTURE) ×1
SUT VIC AB 2-0 CT1 TAPERPNT 27 (SUTURE) ×1 IMPLANT
SUT VIC AB 4-0 KS 27 (SUTURE) ×2 IMPLANT
TOWEL OR 17X24 6PK STRL BLUE (TOWEL DISPOSABLE) ×2 IMPLANT
TRAY FOLEY W/BAG SLVR 14FR LF (SET/KITS/TRAYS/PACK) ×2 IMPLANT
WATER STERILE IRR 1000ML POUR (IV SOLUTION) ×2 IMPLANT

## 2020-07-31 NOTE — Op Note (Signed)
Cesarean Section Operative Report  Alpha Chouinard  07/31/2020  Indications: Breech Presentation   Pre-operative Diagnosis: primary cesarean section;  breech presentation; rupture of membranes.   Post-operative Diagnosis: Same   Surgeon: Surgeon(s) and Role:    * Alva Broxson, Wilfred Curtis, MD - Primary    * Marsala, Arlana Pouch, MD - Assisting   Attending Attestation: I was present and scrubbed for the entire procedure.   Anesthesia: spinal    Estimated Blood Loss: 166 ml  Total IV Fluids: 150 ml LR  Urine Output:: 150 ml clear yellow urine  Specimens: none  Findings: Viable female infant in breech presentation; Apgars pending; weight pending; arterial cord pH not obtained;  light meconium-stained amniotic fluid; intact placenta with three vessel cord; normal uterus, fallopian tubes and ovaries bilaterally.  Baby condition / location:  Couplet care / Skin to Skin   Complications: no complications  Indications: Nikeya Maxim is a 20 y.o. G1P0 with an IUP [redacted]w[redacted]d presenting with SROM and breech presentation.  The risks, benefits, complications, treatment options, and exected outcomes were discussed with the patient . The patient dwith the proposed plan, giving informed consent. identified as Jacquese Cassarino and the procedure verified as C-Section Delivery.  Procedure Details:  The patient was taken back to the operative suite where spinal anesthesia was placed.  A time out was held and the above information confirmed.   After induction of anesthesia, the patient was draped and prepped in the usual sterile manner and placed in a dorsal supine position with a leftward tilt. A Pfannenstiel incision was made and carried down through the subcutaneous tissue to the fascia. Fascial incision was made and bluntly extended transversely. The fascia was separated from the underlying rectus tissue superiorly and inferiorly. The peritoneum was identified and sharply entered and extended longitudinally.  Alexis retractor was placed. A bladder flap was not created. A low transverse uterine incision was made and extended bluntly. Delivered from breech presentation was a viable infant with Apgars and weight as above.  After waiting 60 seconds for delayed cord cutting, the umbilical cord was clamped and cut cord blood was obtained for evaluation. Cord ph was not sent. The placenta was removed Intact and appeared normal. The uterine outline, tubes and ovaries appeared normal. The uterine incision was closed with running locked sutures of 0-Vicryl with an imbricating layer of the same.   Hemostasis was observed. The peritoneum was closed with 2-0 Vicryl. The rectus muscles were examined and hemostasis observed. The fascia was then reapproximated with running sutures of 0-Vicryl. The subcuticular closure was performed using 2-0 plain gut. The skin was closed with 4-0 Vicryl.  Instrument, sponge, and needle counts were correct prior the abdominal closure and were correct at the conclusion of the case.     Disposition: PACU - hemodynamically stable.   Maternal Condition: stable       Signed: Lavonne Chick 07/31/2020 4:16 PM

## 2020-07-31 NOTE — H&P (Signed)
LABOR AND DELIVERY ADMISSION HISTORY AND PHYSICAL NOTE  Becky Mclaughlin is a 20 y.o. female G34P0 with IUP at 30w5dby lmp presenting for rupture of membranes at 11 AM today. Last ate 11 AM today.   She reports positive fetal movement. Denies bleeding. Contracting every few minutes, moderate in severity  Prenatal History/Complications:  Past Medical History: Past Medical History:  Diagnosis Date   Acid reflux    Anxiety    Kidney disease     Past Surgical History: Past Surgical History:  Procedure Laterality Date   MOUTH SURGERY  2018    Obstetrical History: OB History     Gravida  1   Para      Term      Preterm      AB      Living  0      SAB      IAB      Ectopic      Multiple      Live Births              Social History: Social History   Socioeconomic History   Marital status: Single    Spouse name: Not on file   Number of children: 0   Years of education: Not on file   Highest education level: Not on file  Occupational History   Not on file  Tobacco Use   Smoking status: Never   Smokeless tobacco: Never  Vaping Use   Vaping Use: Never used  Substance and Sexual Activity   Alcohol use: Not Currently    Comment: occasional   Drug use: Never   Sexual activity: Not Currently  Other Topics Concern   Not on file  Social History Narrative   Not on file   Social Determinants of Health   Financial Resource Strain: Not on file  Food Insecurity: Not on file  Transportation Needs: Not on file  Physical Activity: Not on file  Stress: Not on file  Social Connections: Not on file    Family History: Family History  Problem Relation Age of Onset   Hypertension Mother        "had hole in her heart"   Hypertension Father    Stroke Cousin        At age 22, used to vape    Allergies: Allergies  Allergen Reactions   Fluconazole Other (See Comments)    Syncopal episode and , hot flashes and feeling lightheaded, jittery     Medications Prior to Admission  Medication Sig Dispense Refill Last Dose   amoxicillin (AMOXIL) 875 MG tablet Take 875 mg by mouth 2 (two) times daily.   07/31/2020   Prenatal Vit-Fe Fumarate-FA (MULTIVITAMIN-PRENATAL) 27-0.8 MG TABS tablet Take 1 tablet by mouth in the morning.   07/31/2020   Blood Pressure Monitoring (BLOOD PRESSURE KIT) DEVI 1 kit by Does not apply route as needed. 1 each 0    Doxylamine-Pyridoxine (DICLEGIS) 10-10 MG TBEC Take 2 tablets by mouth at bedtime. If symptoms persist, add one tablet in the morning and one in the afternoon (Patient not taking: No sig reported) 100 tablet 5    hydrOXYzine (VISTARIL) 25 MG capsule Take 1 capsule (25 mg total) by mouth daily as needed. (Patient not taking: No sig reported) 30 capsule 0      Review of Systems   All systems reviewed and negative except as stated in HPI  Blood pressure 124/85, pulse 96, temperature 98.2 F (36.8 C), temperature source  Oral, resp. rate 16, height '5\' 9"'  (1.753 m), weight 74.5 kg, last menstrual period 10/06/2019, SpO2 97 %. General appearance: alert, cooperative, and appears stated age Lungs: clear to auscultation bilaterally Heart: regular rate and rhythm Abdomen: soft, non-tender; bowel sounds normal Extremities: No calf swelling or tenderness Presentation: breech Fetal monitoring: 125/mod/+a/-d Uterine activity: q 4 min  5 cm dilated per rn exam   Prenatal labs: ABO, Rh: O/Negative/-- (11/22 1106) Antibody: Negative (11/22 1106) Rubella: 8.64 (11/22 1106) RPR: Non Reactive (04/08 1027)  HBsAg: Negative (11/22 1106)  HIV: Non Reactive (04/08 1027)  GBS: Negative/-- (05/27 1136)  2 hr Glucola: wnl Genetic screening:  wnl Anatomy US: wnl save for intracardiac echogenic focus  Prenatal Transfer Tool  Maternal Diabetes: No Genetic Screening: Normal Maternal Ultrasounds/Referrals: Normal Fetal Ultrasounds or other Referrals:  None Maternal Substance Abuse:  No Significant Maternal  Medications:  None Significant Maternal Lab Results: Group B Strep negative  Results for orders placed or performed during the hospital encounter of 07/31/20 (from the past 24 hour(s))  Fern Test   Collection Time: 07/31/20  1:24 PM  Result Value Ref Range   POCT Fern Test Positive = ruptured amniotic membanes     Patient Active Problem List   Diagnosis Date Noted   Cesarean delivery delivered 07/31/2020   Breech presentation, single or unspecified fetus 07/24/2020   Breech presentation, no version 07/23/2020   Pediatric pre-birth visit for expectant parent 06/22/2020   Echogenic intracardiac focus of fetus on prenatal ultrasound 03/17/2020   Rh negative, antepartum 02/08/2020   Sciatic nerve pain, left 02/08/2020   Supervision of normal first pregnancy, antepartum 12/16/2019   Depressive disorder 02/13/2019   Hypertrophy of vulva 02/13/2019   Generalized anxiety disorder with panic attacks 11/29/2018   Gastroesophageal reflux disease without esophagitis 05/09/2018   Henoch-Schonlein purpura ([place]) 11/18/2010    Assessment: Becky Mclaughlin is a 20 y.o. G1P0 at 75w5dhere for SROM at 11 AM today with onset of labor. Light meconium, category 1 tracing.   The risks of cesarean section were discussed with the patient including but were not limited to: bleeding which may require transfusion or reoperation; infection which may require antibiotics; injury to bowel, bladder, ureters or other surrounding organs; injury to the fetus; need for additional procedures including hysterectomy in the event of a life-threatening hemorrhage; placental abnormalities wth subsequent pregnancies, incisional problems, thromboembolic phenomenon and other postoperative/anesthesia complications.  Patient has been NPO since 11 am she will remain NPO for procedure. Anesthesia and OR aware.  Preoperative prophylactic antibiotics and SCDs ordered on call to the OR.  To OR when ready.   # SROM, breech: last meal at 11.  Appears to be in labor, will confer with anesthesiology regarding timing of surgery.   # Rh neg: PP w/u   #FWB: Cat 1 #ID:  Ancef ordered #MOF: breast #MOC: depo #Circ:  N/a  NPatsy LagerWouk 07/31/2020, 1:37 PM

## 2020-07-31 NOTE — MAU Note (Signed)
Scheduled for a c/s because she is breech.  Water broke at around 11, clear- yellowish fluid, thinks it is mostly out. Is contracting every 7 min.

## 2020-07-31 NOTE — Telephone Encounter (Signed)
Call to patient. Advised c-section scheduld for Sun, 08-02-20 at 0900- arrive 0700. Should expect pre-op nurse to call with instructions.  Encounter closed.

## 2020-07-31 NOTE — Anesthesia Postprocedure Evaluation (Signed)
Anesthesia Post Note  Patient: Becky Mclaughlin  Procedure(s) Performed: CESAREAN SECTION     Patient location during evaluation: PACU Anesthesia Type: Spinal Level of consciousness: awake Pain management: pain level controlled Vital Signs Assessment: post-procedure vital signs reviewed and stable Respiratory status: spontaneous breathing Cardiovascular status: stable Postop Assessment: no headache, no backache, spinal receding, patient able to bend at knees and no apparent nausea or vomiting Anesthetic complications: no   No notable events documented.  Last Vitals:  Vitals:   07/31/20 1745 07/31/20 1748  BP: 119/77   Pulse: 80 88  Resp: 16 19  Temp:  36.8 C  SpO2:  100%    Last Pain:  Vitals:   07/31/20 1748  TempSrc: Oral  PainSc:    Pain Goal:    LLE Motor Response: Purposeful movement (07/31/20 1740) LLE Sensation: Tingling (07/31/20 1740) RLE Motor Response: Purposeful movement (07/31/20 1740) RLE Sensation: Tingling (07/31/20 1740)     Epidural/Spinal Function Cutaneous sensation: Tingles (07/31/20 1740), Patient able to flex knees: Yes (07/31/20 1740), Patient able to lift hips off bed: No (07/31/20 1740), Back pain beyond tenderness at insertion site: No (07/31/20 1740), Progressively worsening motor and/or sensory loss: No (07/31/20 1740), Bowel and/or bladder incontinence post epidural: No (07/31/20 1740)  Caren Macadam

## 2020-07-31 NOTE — Patient Instructions (Addendum)
Lamiah Marmol  07/31/2020   Your procedure is scheduled on:  08/02/2020  Arrive at 0600 at Entrance C on [address] at [facility]  and [employer]. You are invited to use the FREE valet parking or use the Visitor's parking deck.  Pick up the phone at the desk and dial [phone].  Call this number if you have problems the morning of surgery: [phone]  Remember:   Do not eat food:(After Midnight) Desps de medianoche.  Do not drink clear liquids: (After Midnight) Desps de medianoche.  Take these medicines the morning of surgery with A SIP OF WATER:  Take amoxicillin as prescribed   Do not wear jewelry, make-up or nail polish.  Do not wear lotions, powders, or perfumes. Do not wear deodorant.  Do not shave 48 hours prior to surgery.  Do not bring valuables to the hospital.  [facility] is not   responsible for any belongings or valuables brought to the hospital.  Contacts, dentures or bridgework may not be worn into surgery.  Leave suitcase in the car. After surgery it may be brought to your room.  For patients admitted to the hospital, checkout time is 11:00 AM the day of              discharge.      Please read over the following fact sheets that you were given:     Preparing for Surgery

## 2020-07-31 NOTE — Progress Notes (Signed)
Patient requested that Rn called MD to get an order for amoxicillin she was taking at home. Patient states that she is taking 875mg  twicw daily. Kim ordered 500 mg three times a day.

## 2020-07-31 NOTE — Transfer of Care (Signed)
Immediate Anesthesia Transfer of Care Note  Patient: Becky Mclaughlin  Procedure(s) Performed: CESAREAN SECTION  Patient Location: PACU  Anesthesia Type:Spinal  Level of Consciousness: awake and alert   Airway & Oxygen Therapy: Patient Spontanous Breathing  Post-op Assessment: Report given to RN  Post vital signs: Reviewed  Last Vitals:  Vitals Value Taken Time  BP    Temp    Pulse 92 07/31/20 1637  Resp 16 07/31/20 1637  SpO2 100 % 07/31/20 1637  Vitals shown include unvalidated device data.  Last Pain:  Vitals:   07/31/20 1450  TempSrc:   PainSc: 3          Complications: No notable events documented.

## 2020-07-31 NOTE — Anesthesia Preprocedure Evaluation (Signed)
Anesthesia Evaluation  Patient identified by MRN, date of birth, ID band Patient awake    Reviewed: Allergy & Precautions, NPO status , Patient's Chart, lab work & pertinent test results  Airway Mallampati: I       Dental no notable dental hx.    Pulmonary neg pulmonary ROS,    Pulmonary exam normal        Cardiovascular negative cardio ROS Normal cardiovascular exam     Neuro/Psych PSYCHIATRIC DISORDERS Anxiety    GI/Hepatic Neg liver ROS, GERD  ,  Endo/Other  negative endocrine ROS  Renal/GU   negative genitourinary   Musculoskeletal   Abdominal Normal abdominal exam  (+)   Peds  Hematology negative hematology ROS (+)   Anesthesia Other Findings   Reproductive/Obstetrics                             Anesthesia Physical Anesthesia Plan  ASA: 2  Anesthesia Plan: Spinal   Post-op Pain Management:    Induction:   PONV Risk Score and Plan: Ondansetron, Dexamethasone and Scopolamine patch - Pre-op  Airway Management Planned: Natural Airway, Nasal Cannula and Simple Face Mask  Additional Equipment: None  Intra-op Plan:   Post-operative Plan:   Informed Consent: I have reviewed the patients History and Physical, chart, labs and discussed the procedure including the risks, benefits and alternatives for the proposed anesthesia with the patient or authorized representative who has indicated his/her understanding and acceptance.       Plan Discussed with: CRNA  Anesthesia Plan Comments:         Anesthesia Quick Evaluation

## 2020-07-31 NOTE — Lactation Note (Signed)
This note was copied from a baby's chart. Lactation Consultation Note  Patient Name: Becky Mclaughlin [id number] Date: 07/31/2020 Reason for consult: Initial assessment;Mother's request;Primapara;1st time breastfeeding;Early term 37-38.6wks Age:20 years Mom stated infant just latched for 30 minutes prior to [facility] arrival. I was unable to see a latch. Infant resting comfortably on top of Mom during the visit.   LC reviewed with Mother feeding cues, different positions, how to get a deep latch and hand expression/feeding drops of colostrum via spoon.   Plan 1. To feed based on cues 8-12x in 24 hr period no more than 4 hrs without an attempt. Mom to offer both breasts and with breast compression look for signs of milk transfer.            2. Mom to offer EBM via spoon if unable to get infant to latch.            3. I and O sheet reviewed.            4 LC brochure of inpatient and outpatient services reviewed.   All questions answered at the end of the visit.   Maternal Data Has patient been taught Hand Expression?: Yes Does the patient have breastfeeding experience prior to this delivery?: No  Feeding Mother's Current Feeding Choice: Breast Milk  LATCH Score Latch: Grasps breast easily, tongue down, lips flanged, rhythmical sucking.  Audible Swallowing: A few with stimulation  Type of Nipple: Everted at rest and after stimulation  Comfort (Breast/Nipple): Soft / non-tender  Hold (Positioning): Assistance needed to correctly position infant at breast and maintain latch.  LATCH Score: 8   Lactation Tools Discussed/Used    Interventions Interventions: Breast feeding basics reviewed;Breast compression;Skin to skin;Breast massage;Position options;Hand express;Expressed milk;Education  Discharge Pump: Personal WIC Program: No  Consult Status Consult Status: Follow-up Date: 08/01/20 Follow-up type: In-patient    Vanna Shavers  Nicholson-Springer 07/31/2020, 7:54 PM

## 2020-08-01 LAB — CBC
HCT: 34.8 % — ABNORMAL LOW (ref 36.0–46.0)
Hemoglobin: 11.6 g/dL — ABNORMAL LOW (ref 12.0–15.0)
MCH: 30.1 pg (ref 26.0–34.0)
MCHC: 33.3 g/dL (ref 30.0–36.0)
MCV: 90.2 fL (ref 80.0–100.0)
Platelets: 254 10*3/uL (ref 150–400)
RBC: 3.86 MIL/uL — ABNORMAL LOW (ref 3.87–5.11)
RDW: 13.4 % (ref 11.5–15.5)
WBC: 12.7 10*3/uL — ABNORMAL HIGH (ref 4.0–10.5)
nRBC: 0 % (ref 0.0–0.2)

## 2020-08-01 LAB — RPR: RPR Ser Ql: NONREACTIVE

## 2020-08-01 MED ORDER — RHO D IMMUNE GLOBULIN 1500 UNIT/2ML IJ SOSY
300.0000 ug | PREFILLED_SYRINGE | Freq: Once | INTRAMUSCULAR | Status: AC
  Administered 2020-08-01: 300 ug via INTRAVENOUS
  Filled 2020-08-01: qty 2

## 2020-08-01 MED ORDER — RHO D IMMUNE GLOBULIN 1500 UNIT/2ML IJ SOSY
300.0000 ug | PREFILLED_SYRINGE | Freq: Once | INTRAMUSCULAR | Status: DC
  Filled 2020-08-01: qty 2

## 2020-08-01 MED ORDER — ENOXAPARIN SODIUM 40 MG/0.4ML IJ SOSY
40.0000 mg | PREFILLED_SYRINGE | INTRAMUSCULAR | Status: DC
  Administered 2020-08-01: 40 mg via SUBCUTANEOUS
  Filled 2020-08-01: qty 0.4

## 2020-08-01 MED ORDER — IBUPROFEN 600 MG PO TABS
600.0000 mg | ORAL_TABLET | Freq: Four times a day (QID) | ORAL | Status: DC
  Administered 2020-08-01 – 2020-08-02 (×5): 600 mg via ORAL
  Filled 2020-08-01 (×5): qty 1

## 2020-08-01 NOTE — Lactation Note (Signed)
This note was copied from a baby's chart. Lactation Consultation Note  Patient Name: Girl Aryam Zhan [id number] Date: 08/01/2020 Reason for consult: Follow-up assessment Age:22 Hours  Follow up to check mother and to observe Left breast for redness. Area much lighter and almost clear. Mothers skin is very sensitive to pressure.  Advised her not to compress breast so hard when she is supporting or compressing her breast tissue.    Maternal Data    Feeding Mother's Current Feeding Choice: Breast Milk  LATCH Score Latch: Repeated attempts needed to sustain latch, nipple held in mouth throughout feeding, stimulation needed to elicit sucking reflex.  Audible Swallowing: Spontaneous and intermittent  Type of Nipple: Everted at rest and after stimulation  Comfort (Breast/Nipple): Filling, red/small blisters or bruises, mild/mod discomfort (compressed when infant released the breast)  Hold (Positioning): Assistance needed to correctly position infant at breast and maintain latch.  LATCH Score: 7   Lactation Tools Discussed/Used    Interventions    Discharge    Consult Status Consult Status: Follow-up Date: 08/01/20 Follow-up type: In-patient    Stevan Born [facility] 08/01/2020, 5:14 PM

## 2020-08-01 NOTE — Lactation Note (Signed)
This note was copied from a baby's chart. Lactation Consultation Note  Patient Name: Girl Don Giarrusso [id number]'[id number] Date: 08/01/2020 Reason for consult: Follow-up assessment Age:22 hours  Staff nurse reports that mother has a red warm area on the Lt breast.  LC followed up to assess mother's breast. Observed that she does have a red area at 12 o'clock approx. 3 cm's in size on the Left breast.  Mother has bilateral cracks to her nipples. Mother denies pain or tenderness of breast. She also reports that she doesn't have pain with infants latch.   Mother reports that infant is mostly breastfeeding on the left breast in cross cradle hold. Mother reports that this is the area where she is holding her breast.  Observed red marks under mothers Rt breast . Mother reports that she is itching. from her medicine.  Informed staff nurse to be aware to observe. Will follow up with mother to see if seems to be a pressure area. Advised mother  to page [facility] for latch assist with next feeding.    Maternal Data    Feeding Mother's Current Feeding Choice: Breast Milk  LATCH Score                    Lactation Tools Discussed/Used    Interventions Interventions: Position options  Discharge    Consult Status Consult Status: Follow-up Date: 08/01/20 Follow-up type: In-patient    Stevan Born [facility] 08/01/2020, 10:19 AM

## 2020-08-01 NOTE — Progress Notes (Addendum)
POSTPARTUM PROGRESS NOTE  Post Op Day 1  Subjective:  Becky Mclaughlin is a 20 y.o. G1P1001 s/p pLTCS at [redacted]w[redacted]d.  No acute events overnight. Patient reporting some itchiness throughout her body. Pt denies problems with ambulating or po intake. Has not voided yet. She denies nausea or vomiting.  Pain is well controlled.  She has had flatus. She has not had bowel movement.  Lochia Minimal.   Objective: Blood pressure 103/64, pulse 81, temperature 97.9 F (36.6 C), resp. rate 18, height 5\' 9"  (1.753 m), weight 74.5 kg, last menstrual period 10/06/2019, SpO2 97 %, unknown if currently breastfeeding.  Physical Exam:  General: alert, cooperative and no distress Chest: no respiratory distress Heart:regular rate Abdomen: soft, nontender,  Uterine Fundus: firm, appropriately tender DVT Evaluation: No calf swelling or tenderness Extremities: no edema Skin: warm, dry; honeycomb dressing clean/dry/intact  Recent Labs    07/31/20 1351 08/01/20 0447  HGB 13.7 11.6*  HCT 41.4 34.8*    Assessment/Plan: Becky Mclaughlin is a 20 y.o. G1P1001 s/p pLTCS at [redacted]w[redacted]d   POD#1 - Doing well Anxiety/depression: SW to see Rh negative: baby B+, will order rhogam Contraception: condoms Feeding: breast Dispo: Plan for discharge likely tomorrow.   LOS: 1 day   [redacted]w[redacted]d, MD 08/01/2020, 8:01 AM

## 2020-08-01 NOTE — Progress Notes (Signed)
Becky Mclaughlin was referred for history of depression/anxiety. * Referral screened out by Clinical Social Worker because none of the following criteria appear to apply:  ~ History of anxiety/depression during this pregnancy, or of post-partum depression following prior delivery. ~ Diagnosis of anxiety and/or depression within last 3 years OR * Becky Mclaughlin's symptoms currently being treated with medication and/or therapy. Per chart, Becky Mclaughlin's symptoms are currently being treated with Vistaril.    Please contact the Clinical Social Worker if needs arise, by Becky Mclaughlin request, or if Becky Mclaughlin scores greater than 9/yes to question 10 on Edinburgh Postpartum Depression Screen.    Deshay Kirstein, MSW, LCSW-A Clinical Social Worker [phone]    

## 2020-08-01 NOTE — Lactation Note (Signed)
This note was copied from a baby's chart. Lactation Consultation Note  Patient Name: Becky Mclaughlin Date: 08/01/2020 Reason for consult: Follow-up assessment Age:22 hours Mother paged for [facility] assistance with latch.  Assist mother with placing infant to the Rt breast. Mother reports that she has had difficulty getting infant to latch on the rt breast. Several attempts made to latch infant. Nipple appeared shorted. A tea-cup hold was used to get infant latched.  Infant latched but had to adjust infants upper lip for wider gape and adjust lower jaw . Mother denies pain. Infant sustained latch for 20-25 mins.  When infant released the breast, mothers nipple was compressed and pinched. Observed that infant has a tight upper lip tie and a tight restriction under her tongue.  Encouraged mother to pre-pump and hand express before she latches infant.  Mother to continue to cue base feed infant and feed at least 8-12 times or more in 24 hours and advised to allow for cluster feeding infant as needed.  Mother to continue to due STS. Mother is aware of available LC services at [facility], BFSG'S, OP Dept, and phone # for questions or concerns about breastfeeding.  Mother receptive to all teaching and plan of care.    Maternal Data    Feeding Mother's Current Feeding Choice: Breast Milk  LATCH Score Latch: Repeated attempts needed to sustain latch, nipple held in mouth throughout feeding, stimulation needed to elicit sucking reflex.  Audible Swallowing: Spontaneous and intermittent  Type of Nipple: Everted at rest and after stimulation  Comfort (Breast/Nipple): Filling, red/small blisters or bruises, mild/mod discomfort (compressed when infant released the breast)  Hold (Positioning): Assistance needed to correctly position infant at breast and maintain latch.  LATCH Score: 7   Lactation Tools Discussed/Used    Interventions    Discharge    Consult Status Consult Status:  Follow-up Date: 08/01/20 Follow-up type: In-patient    Stevan Born [facility] 08/01/2020, 5:18 PM

## 2020-08-02 ENCOUNTER — Inpatient Hospital Stay (HOSPITAL_COMMUNITY)
Admission: RE | Admit: 2020-08-02 | Payer: Medicaid Other | Source: Home / Self Care | Admitting: Obstetrics and Gynecology

## 2020-08-02 ENCOUNTER — Encounter (HOSPITAL_COMMUNITY): Admission: RE | Payer: Self-pay | Source: Home / Self Care

## 2020-08-02 LAB — RH IG WORKUP (INCLUDES ABO/RH)
Fetal Screen: NEGATIVE
Gestational Age(Wks): 38.5
Unit division: 0

## 2020-08-02 SURGERY — Surgical Case
Anesthesia: Regional

## 2020-08-02 MED ORDER — COCONUT OIL OIL: 1.0000 "application " | TOPICAL_OIL | 0 refills | Status: DC | PRN

## 2020-08-02 MED ORDER — ACETAMINOPHEN 500 MG PO TABS: 1000.0000 mg | ORAL_TABLET | Freq: Four times a day (QID) | ORAL | Status: DC | PRN

## 2020-08-02 MED ORDER — IBUPROFEN 600 MG PO TABS: 600.0000 mg | ORAL_TABLET | Freq: Four times a day (QID) | ORAL | 0 refills | Status: DC | PRN

## 2020-08-02 MED ORDER — OXYCODONE HCL 5 MG PO TABS: 5.0000 mg | ORAL_TABLET | Freq: Four times a day (QID) | ORAL | 0 refills | Status: DC | PRN

## 2020-08-02 NOTE — Discharge Summary (Signed)
Postpartum Discharge Summary  Date of Service updated 08/02/20     Patient Name: Becky Mclaughlin DOB: 24-Oct-2000 MRN: [id number]  Date of admission: 07/31/2020 Delivery date:07/31/2020  Delivering provider: Laurey Arrow [place]  Date of discharge: 08/02/2020  Admitting diagnosis: Cesarean delivery delivered [O82] Status post primary low transverse cesarean section [Z98.891] Intrauterine pregnancy: [redacted]w[redacted]d    Secondary diagnosis:  Active Problems:   Supervision of normal first pregnancy, antepartum   Rh negative, antepartum   Depressive disorder   Generalized anxiety disorder with panic attacks   Cesarean delivery delivered   Status post primary low transverse cesarean section  Additional problems: none    Discharge diagnosis: Term Pregnancy Delivered                                              Post partum procedures:rhogam Augmentation: N/A Complications: None  Hospital course: Onset of Labor With Unplanned C/S   20y.o. yo G1P1001 at 334w5das admitted in Latent Labor on 07/31/2020. The patient went for cesarean section due to Malpresentation. Delivery details as follows: Membrane Rupture Time/Date: 11:15 AM ,07/31/2020   Delivery Method:C-Section, Low Transverse  Details of operation can be found in separate operative note. Patient had an uncomplicated postpartum course.  She is ambulating,tolerating a regular diet, passing flatus, and urinating well.  Patient is discharged home in stable condition 08/02/20.  Newborn Data: Birth date:07/31/2020  Birth time:3:53 PM  Gender:Female  Living status:Living  Apgars:9 ,9  Weight:3116 g   Magnesium Sulfate received: No BMZ received: No Rhophylac:Yes MMR:N/A T-DaP:Given prenatally Flu: No Transfusion:No  Physical exam  Vitals:   08/01/20 0530 08/01/20 1327 08/01/20 2324 08/02/20 0607  BP: 1'03/64 93/70 96/65 ' 111/72  Pulse: 81 72 77 90  Resp: '18 16 18 18  ' Temp: 97.9 F (36.6 C) 98.5 F (36.9 C) 97.9 F (36.6 C) 97.8 F  (36.6 C)  TempSrc:  Oral Oral Oral  SpO2: 97% 99% 100% 98%  Weight:      Height:       General: alert, cooperative, and no distress Lochia: appropriate Uterine Fundus: firm Incision: Dressing is clean, dry, and intact DVT Evaluation: No evidence of DVT seen on physical exam. Labs: Lab Results  Component Value Date   WBC 12.7 (H) 08/01/2020   HGB 11.6 (L) 08/01/2020   HCT 34.8 (L) 08/01/2020   MCV 90.2 08/01/2020   PLT 254 08/01/2020   CMP Latest Ref Rng & Units 02/23/2018  Glucose 70 - 99 mg/dL 82  BUN 6 - 20 mg/dL 9  Creatinine 0.44 - 1.00 mg/dL 0.62  Sodium 135 - 145 mmol/L 137  Potassium 3.5 - 5.1 mmol/L 4.0  Chloride 98 - 111 mmol/L 105  CO2 22 - 32 mmol/L 23  Calcium 8.9 - 10.3 mg/dL 9.5  Total Protein 6.5 - 8.1 g/dL 7.6  Total Bilirubin 0.3 - 1.2 mg/dL 0.5  Alkaline Phos 38 - 126 U/L 67  AST 15 - 41 U/L 21  ALT 0 - 44 U/L 12   Edinburgh Score: No flowsheet data found.   After visit meds:  Allergies as of 08/02/2020       Reactions   Fluconazole Other (See Comments)   Syncopal episode and , hot flashes and feeling lightheaded, jittery        Medication List     STOP taking these medications  amoxicillin 875 MG tablet Commonly known as: AMOXIL   Doxylamine-Pyridoxine 10-10 MG Tbec Commonly known as: Diclegis   hydrOXYzine 25 MG capsule Commonly known as: VISTARIL       TAKE these medications    acetaminophen 500 MG tablet Commonly known as: TYLENOL Take 2 tablets (1,000 mg total) by mouth every 6 (six) hours as needed for mild pain (temperature > 101.5.).   Blood Pressure Kit Devi 1 kit by Does not apply route as needed.   coconut oil Oil Apply 1 application topically as needed.   ibuprofen 600 MG tablet Commonly known as: ADVIL Take 1 tablet (600 mg total) by mouth every 6 (six) hours as needed.   multivitamin-prenatal 27-0.8 MG Tabs tablet Take 1 tablet by mouth in the morning.   oxyCODONE 5 MG immediate release  tablet Commonly known as: Oxy IR/ROXICODONE Take 1 tablet (5 mg total) by mouth every 6 (six) hours as needed for moderate pain.         Discharge home in stable condition Infant Feeding: Breast Infant Disposition:home with mother Discharge instruction: per After Visit Summary and Postpartum booklet. Activity: Advance as tolerated. Pelvic rest for 6 weeks.  Diet: routine diet Future Appointments:No future appointments. Follow up Visit: Message sent to [facility] 08/02/20 by Sylvester Harder.   Please schedule this patient for a In person postpartum visit in 6 weeks with the following provider: Any provider. Additional Postpartum F/U:Postpartum Depression checkup and Incision check 1 week  Low risk pregnancy complicated by:  n/a Delivery mode:  C-Section, Low Transverse  Anticipated Birth Control:  Condoms   7/61/8485 Arrie Senate, MD

## 2020-08-06 ENCOUNTER — Encounter: Payer: Medicaid Other | Admitting: Obstetrics and Gynecology

## 2020-08-07 ENCOUNTER — Ambulatory Visit (INDEPENDENT_AMBULATORY_CARE_PROVIDER_SITE_OTHER): Payer: Medicaid Other

## 2020-08-07 ENCOUNTER — Other Ambulatory Visit: Payer: Self-pay

## 2020-08-07 DIAGNOSIS — Z4889 Encounter for other specified surgical aftercare: Secondary | ICD-10-CM

## 2020-08-07 NOTE — Progress Notes (Signed)
Pt presents for incision check s/p low transverse C/S on 07/31/20. Honeycomb dressing and steri-strips removed without difficulty. The wound is cleansed, dry, and intact. The patient is alerted to watch for any signs of infection (redness, pus, pain, increased swelling or fever) and call if such occurs. Home wound care instructions are provided. Tetanus vaccination status reviewed: last tetanus booster within 10 years.

## 2020-08-07 NOTE — Progress Notes (Signed)
Patient was assessed and managed by nursing staff during this encounter. I have reviewed the chart and agree with the documentation and plan. I have also made any necessary editorial changes.  Catalina Antigua, MD 08/07/2020 11:35 AM

## 2020-08-17 ENCOUNTER — Ambulatory Visit: Payer: Medicaid Other | Admitting: Licensed Clinical Social Worker

## 2020-08-17 DIAGNOSIS — F32A Depression, unspecified: Secondary | ICD-10-CM

## 2020-08-19 NOTE — BH Specialist Note (Signed)
Integrated Behavioral Health via Telemedicine Visit  08/19/2020 Becky Mclaughlin [id number]  Number of Integrated Behavioral Health visits: 1 Session Start time: 1:00pm  Session End time: 1:15pm Total time: 15 via mychart   Referring Provider: Hospital Discharge  Patient/Family location: Home  [facility] Provider location: [facility]  All persons participating in visit: [name] and LCSWA A. Felton Clinton  Types of Service: General Behavioral Integrated Care (BHI)  I connected with Becky Mclaughlin and/or Becky Mclaughlin's n/a via  Telephone or [employer]  (Video is Caregility application) and verified that I am speaking with the correct person using two identifiers. Discussed confidentiality: Yes   I discussed the limitations of telemedicine and the availability of in person appointments.  Discussed there is a possibility of technology failure and discussed alternative modes of communication if that failure occurs.  I discussed that engaging in this telemedicine visit, they consent to the provision of behavioral healthcare and the services will be billed under their insurance.  Patient and/or legal guardian expressed understanding and consented to Telemedicine visit: Yes   Presenting Concerns: Patient and/or family reports the following symptoms/concerns: Mood Check  Duration of problem: n/a; Severity of problem: mild  Patient and/or Family's Strengths/Protective Factors: Concrete supports in place (healthy food, safe environments, etc.)  Goals Addressed: Patient will:  Reduce symptoms of: none   Increase knowledge and/or ability of: n/a   Demonstrate ability to: n/a  Progress towards Goals: Other  Interventions: Interventions utilized:  Motivational Interviewing Standardized Assessments completed: Not Needed  Patient and/or Family Response: Pt did not need a mood check. No prior Social Work or [facility] consult and edinburgh score is 1  Assessment: Patient completed  mood check .   Patient may benefit from n/a.  Plan: Follow up with behavioral health clinician on : as needed  Behavioral recommendations: none Referral(s): none  I discussed the assessment and treatment plan with the patient and/or parent/guardian. They were provided an opportunity to ask questions and all were answered. They agreed with the plan and demonstrated an understanding of the instructions.   They were advised to call back or seek an in-person evaluation if the symptoms worsen or if the condition fails to improve as anticipated.  Gwyndolyn Saxon, LCSW

## 2020-08-28 ENCOUNTER — Telehealth: Payer: Self-pay | Admitting: *Deleted

## 2020-08-28 NOTE — Telephone Encounter (Signed)
Pt called to office with questions about PP and dizziness. Pt states she has been having some dizziness since her delivery. Pt states that this morning she went to stand up and was dizzy and "blacked out" for a few seconds.   Pt was advised to stay well hydrated, eat well and monitor BP.  Pt made aware she may monitor symptoms but if this occurs again, she should be seen for eval. Pt made aware she may be seen at Urgent care or any ED.    Pt states understanding.

## 2020-09-14 ENCOUNTER — Encounter: Payer: Self-pay | Admitting: Advanced Practice Midwife

## 2020-09-14 ENCOUNTER — Ambulatory Visit (INDEPENDENT_AMBULATORY_CARE_PROVIDER_SITE_OTHER): Payer: Medicaid Other | Admitting: Advanced Practice Midwife

## 2020-09-14 ENCOUNTER — Other Ambulatory Visit: Payer: Self-pay

## 2020-09-14 VITALS — BP 107/74 | HR 84 | Wt 141.0 lb

## 2020-09-14 DIAGNOSIS — L02419 Cutaneous abscess of limb, unspecified: Secondary | ICD-10-CM | POA: Diagnosis not present

## 2020-09-14 DIAGNOSIS — R42 Dizziness and giddiness: Secondary | ICD-10-CM

## 2020-09-14 NOTE — Progress Notes (Signed)
Post Partum Visit Note  Becky Mclaughlin is a 20 y.o. G19P1001 female who presents for a postpartum visit. She is 6 weeks postpartum following a primary cesarean section.  I have fully reviewed the prenatal and intrapartum course. The delivery was at 38 gestational weeks.  Anesthesia: epidural. Postpartum course has been uncomplicated. Baby is doing well. Baby is feeding by bottle - Carnation Good Start Soy. Bleeding no bleeding. Bowel function is normal. Bladder function is normal. Patient is not sexually active. Contraception method is condoms. Postpartum depression screening: negative.   The pregnancy intention screening data noted above was reviewed. Potential methods of contraception were discussed. The patient elected to proceed with No data recorded.   Edinburgh Postnatal Depression Scale - 09/14/20 1309       Edinburgh Postnatal Depression Scale:  In the Past 7 Days   I have been able to laugh and see the funny side of things. 0    I have looked forward with enjoyment to things. 0    I have blamed myself unnecessarily when things went wrong. 0    I have been anxious or worried for no good reason. 0    I have felt scared or panicky for no good reason. 0    Things have been getting on top of me. 0    I have been so unhappy that I have had difficulty sleeping. 0    I have felt sad or miserable. 0    I have been so unhappy that I have been crying. 0    The thought of harming myself has occurred to me. 0    Edinburgh Postnatal Depression Scale Total 0             Health Maintenance Due  Topic Date Due   HPV VACCINES (1 - 2-dose series) Never done   PAP-Cervical Cytology Screening  Never done   PAP SMEAR-Modifier  Never done    The following portions of the patient's history were reviewed and updated as appropriate: allergies, current medications, past family history, past medical history, past social history, past surgical history, and problem list.  Review of  Systems Pertinent items noted in HPI and remainder of comprehensive ROS otherwise negative.  Objective:  BP 107/74   Pulse 84   Wt 141 lb (64 kg)   LMP 10/06/2019   BMI 20.82 kg/m    VS reviewed, nursing note reviewed,  Constitutional: well developed, well nourished, no distress HEENT: normocephalic CV: normal rate Pulm/chest wall: normal effort Abdomen: soft Neuro: alert and oriented x 3 Skin: warm, dry Psych: affect normal    Assessment:   1. Dizziness --Pt reports episodes of dizziness, near syncope 2 weeks ago, improved now but not resolved.  --Reviewed pt recent PCP labs with Hgb 14, normal ferritin, CMP wnl. --Add thyroid labs as pt also reports low appetite, difficulty eating regularly. Is drinking plenty of fluids.  - TSH - T3, free - T4, free  --F/U in office in 1 month  2. Postpartum examination following cesarean delivery --Doing well, good support at home, bonding well with infant. Stopped breastfeeding due to dizziness, worsening with breastfeeding.  3. Axillary abscess --Dx by PCP, bilateral axillary, soft mild edema on exam today.  May be lactation related, should resolve in a few weeks. --See f/U for dizziness, can evaluate breast tissue as needed.  Plan:   Essential components of care per ACOG recommendations:  1.  Mood and well being: Patient with negative depression screening today.  Reviewed local resources for support.  - Patient tobacco use? No.   - hx of drug use? No.    2. Infant care and feeding:  -Patient currently breastmilk feeding? No.  -Social determinants of health (SDOH) reviewed in EPIC. No concerns  3. Sexuality, contraception and birth spacing - Patient does not want a pregnancy in the next year.   - Reviewed forms of contraception in tiered fashion. Patient desired condoms today.   - Discussed birth spacing of 18 months  4. Sleep and fatigue -Encouraged family/partner/community support of 4 hrs of uninterrupted sleep to  help with mood and fatigue  5. Physical Recovery  - Discussed patients delivery and complications.  - Patient had a C-section for laboring with breech presentation. Reports no problems with incision. - Patient has urinary incontinence? No. - Patient is safe to resume physical and sexual activity  6.  Health Maintenance - HM due items addressed Yes - Last pap smear No results found for: DIAGPAP Pap smear not done at today's visit.  F/U in 3 months, Pap at that visit.  -Breast Cancer screening indicated? No.   7. Chronic Disease/Pregnancy Condition follow up: None  - PCP follow up  Sharen Counter, CNM [facility] for [employer], [facility]

## 2020-09-15 LAB — T4, FREE: Free T4: 1.01 ng/dL (ref 0.82–1.77)

## 2020-09-15 LAB — T3, FREE: T3, Free: 2.7 pg/mL (ref 2.0–4.4)

## 2020-09-15 LAB — TSH: TSH: 0.855 u[IU]/mL (ref 0.450–4.500)

## 2020-10-12 ENCOUNTER — Ambulatory Visit: Payer: Medicaid Other | Admitting: Advanced Practice Midwife

## 2020-10-28 ENCOUNTER — Telehealth: Payer: Self-pay | Admitting: *Deleted

## 2020-10-28 NOTE — Telephone Encounter (Signed)
Pt called to office stating she was seen in July for [facility] and is now having consistent cramping.   Pt states she is no longer breastfeeding, no had regular cycle and is not using any birth control.  Pt denies any urinary symptoms.  Pt states that she is having some cramping daily and would like sooner appt if possible.   Pt advised to keep appt as scheduled, message will be sent to provider. Advised to take Tylenol and/or Ibuprofen as needed for cramping.    Please review and advise on any other recommendations. Pt has f/u appt with you in a couple weeks.

## 2020-10-29 ENCOUNTER — Encounter: Payer: Self-pay | Admitting: *Deleted

## 2020-11-17 ENCOUNTER — Ambulatory Visit (INDEPENDENT_AMBULATORY_CARE_PROVIDER_SITE_OTHER): Payer: BLUE CROSS/BLUE SHIELD | Admitting: Advanced Practice Midwife

## 2020-11-17 ENCOUNTER — Other Ambulatory Visit (HOSPITAL_COMMUNITY)
Admission: RE | Admit: 2020-11-17 | Discharge: 2020-11-17 | Disposition: A | Discharge status: Home / Self Care | Payer: Medicaid Other | Source: Ambulatory Visit | Attending: Advanced Practice Midwife | Admitting: Advanced Practice Midwife

## 2020-11-17 ENCOUNTER — Other Ambulatory Visit: Payer: Self-pay

## 2020-11-17 ENCOUNTER — Encounter: Payer: Self-pay | Admitting: Advanced Practice Midwife

## 2020-11-17 VITALS — BP 116/81 | HR 80 | Wt 130.0 lb

## 2020-11-17 DIAGNOSIS — K529 Noninfective gastroenteritis and colitis, unspecified: Secondary | ICD-10-CM

## 2020-11-17 DIAGNOSIS — Z113 Encounter for screening for infections with a predominantly sexual mode of transmission: Secondary | ICD-10-CM

## 2020-11-17 DIAGNOSIS — Z124 Encounter for screening for malignant neoplasm of cervix: Secondary | ICD-10-CM | POA: Insufficient documentation

## 2020-11-17 DIAGNOSIS — L309 Dermatitis, unspecified: Secondary | ICD-10-CM | POA: Diagnosis not present

## 2020-11-17 DIAGNOSIS — Z01419 Encounter for gynecological examination (general) (routine) without abnormal findings: Secondary | ICD-10-CM | POA: Diagnosis not present

## 2020-11-17 NOTE — Progress Notes (Signed)
Subjective:     Becky Mclaughlin is a 20 y.o. female here at [facility] for a routine exam.  Current complaints: Pt made this appointment due to cramping abdominal pain but this has improved. She reports ongoing dizziness, especially when standing up suddenly or bending over; frequent diarrhea for 1-2 months; rash on her legs that her PCP dx as folliculitis.  Personal health questionnaire reviewed: yes.  Do you have a primary care provider? yes Do you feel safe at home? yes    Health Maintenance Due  Topic Date Due   HPV VACCINES (1 - 2-dose series) Never done   PAP-Cervical Cytology Screening  Never done   PAP SMEAR-Modifier  Never done   INFLUENZA VACCINE  09/21/2020     Risk factors for chronic health problems: Smoking: No Alchohol/how much:  Pt BMI: Body mass index is 19.2 kg/m.   Gynecologic History Patient's last menstrual period was 11/01/2020 (exact date). Contraception: condoms Last Pap: n/a.  Last mammogram: n/a.  Obstetric History OB History  Gravida Para Term Preterm AB Living  1 1 1     1   SAB IAB Ectopic Multiple Live Births        0 1    # Outcome Date GA Lbr Len/2nd Weight Sex Delivery Anes PTL Lv  1 Term 07/31/20 [redacted]w[redacted]d  6 lb 13.9 oz (3.116 kg) F CS-LTranv Spinal  LIV     The following portions of the patient's history were reviewed and updated as appropriate: allergies, current medications, past family history, past medical history, past social history, past surgical history, and problem list.  Review of Systems Pertinent items noted in HPI and remainder of comprehensive ROS otherwise negative.    Objective:   BP 116/81   Pulse 80   Wt 130 lb (59 kg)   LMP 11/01/2020 (Exact Date)   Breastfeeding No   BMI 19.20 kg/m  VS reviewed, nursing note reviewed,  Constitutional: well developed, well nourished, no distress HEENT: normocephalic CV: normal rate Pulm/chest wall: normal effort Breast Exam:  Deferred with low risks and shared decision  making, discussed recommendation to start mammogram between 40-50 yo/  Abdomen: soft Neuro: alert and oriented x 3 Skin: warm, dry Psych: affect normal Pelvic exam: Performed: Cervix pink, visually closed, without lesion, scant white creamy discharge, vaginal walls and external genitalia normal Bimanual exam: Cervix 0/long/high, firm, anterior, neg CMT, uterus nontender, nonenlarged, adnexa without tenderness, enlargement, or mass       Assessment/Plan:   1. Screening for cervical cancer  - Cytology - PAP  2. Dermatitis --Pt with multiple vague symptoms, and she is concerned about immune system or autoimmune issues.  --Will do some basic autoimmune workup labwork. Discussed with pt that these labs are not sensitive or specific enough to diagnose anything but may help direct her PCP or our office in next steps for her care. --Reassurance provided that most immune/autoimmune reactions are temporary and self contained.  --Discussed anxiety as a component and pt is to work on relaxation, decreasing stress.  - Antinuclear Antib (ANA) - Sedimentation rate - CBC - TSH  3. Chronic diarrhea of unknown origin --See above, no infectious cause suspected given chronic status.  - Antinuclear Antib (ANA) - Sedimentation rate - CBC - TSH  4. Screening for STD (sexually transmitted disease)  - Cervicovaginal ancillary only( [place])        Follow up in: 1  year  or as needed.   01/01/2021, CNM 1:40 PM

## 2020-11-17 NOTE — Progress Notes (Signed)
RGYN here for pap.  Pt states she is not having cramping anymore. Had PP visit on 09/14/20  LMP: 11/01/20

## 2020-11-18 LAB — CERVICOVAGINAL ANCILLARY ONLY
Chlamydia: NEGATIVE
Comment: NEGATIVE
Comment: NEGATIVE
Comment: NORMAL
Neisseria Gonorrhea: NEGATIVE
Trichomonas: NEGATIVE

## 2020-11-18 LAB — CBC
Hematocrit: 48.3 % — ABNORMAL HIGH (ref 34.0–46.6)
Hemoglobin: 15.6 g/dL (ref 11.1–15.9)
MCH: 29.8 pg (ref 26.6–33.0)
MCHC: 32.3 g/dL (ref 31.5–35.7)
MCV: 92 fL (ref 79–97)
Platelets: 309 10*3/uL (ref 150–450)
RBC: 5.24 x10E6/uL (ref 3.77–5.28)
RDW: 12.7 % (ref 11.7–15.4)
WBC: 8.4 10*3/uL (ref 3.4–10.8)

## 2020-11-18 LAB — SEDIMENTATION RATE: Sed Rate: 2 mm/hr (ref 0–32)

## 2020-11-18 LAB — ANA: Anti Nuclear Antibody (ANA): NEGATIVE

## 2020-11-18 LAB — TSH: TSH: 1.13 u[IU]/mL (ref 0.450–4.500)

## 2020-11-19 LAB — CYTOLOGY - PAP

## 2020-11-24 ENCOUNTER — Encounter: Payer: Self-pay | Admitting: Advanced Practice Midwife

## 2020-11-24 DIAGNOSIS — R87619 Unspecified abnormal cytological findings in specimens from cervix uteri: Secondary | ICD-10-CM | POA: Insufficient documentation

## 2020-12-09 ENCOUNTER — Encounter: Payer: Medicaid Other | Admitting: Women's Health

## 2020-12-17 ENCOUNTER — Other Ambulatory Visit: Payer: Self-pay

## 2020-12-17 ENCOUNTER — Encounter: Payer: Self-pay | Admitting: Obstetrics

## 2020-12-17 ENCOUNTER — Ambulatory Visit (INDEPENDENT_AMBULATORY_CARE_PROVIDER_SITE_OTHER): Payer: BLUE CROSS/BLUE SHIELD | Admitting: Obstetrics

## 2020-12-17 ENCOUNTER — Other Ambulatory Visit (HOSPITAL_COMMUNITY)
Admission: RE | Admit: 2020-12-17 | Discharge: 2020-12-17 | Disposition: A | Discharge status: Home / Self Care | Payer: BLUE CROSS/BLUE SHIELD | Source: Ambulatory Visit | Attending: Women's Health | Admitting: Women's Health

## 2020-12-17 DIAGNOSIS — Z01812 Encounter for preprocedural laboratory examination: Secondary | ICD-10-CM | POA: Diagnosis not present

## 2020-12-17 DIAGNOSIS — R87612 Low grade squamous intraepithelial lesion on cytologic smear of cervix (LGSIL): Secondary | ICD-10-CM

## 2020-12-17 LAB — POCT URINE PREGNANCY: Preg Test, Ur: NEGATIVE

## 2020-12-17 NOTE — Progress Notes (Signed)
GYN presents for COLPO. °UPT today is NEGATIVE. °

## 2020-12-17 NOTE — Progress Notes (Signed)
Colposcopy Procedure Note  Indications: Pap smear 1 months ago showed: low-grade squamous intraepithelial neoplasia (LGSIL - encompassing HPV,mild dysplasia,CIN I). The prior pap showed no abnormalities.  Prior cervical/vaginal disease: normal exam without visible pathology. Prior cervical treatment: no treatment.  Procedure Details  The risks and benefits of the procedure and Written informed consent obtained.  A time-out was performed confirming the patient, procedure and allergy status  Speculum placed in vagina and excellent visualization of cervix achieved, cervix swabbed x 3 with acetic acid solution.  Findings: Cervix: acetowhite lesion(s) noted at 2, 6, and 12 o'clock; SCJ visualized 360 degrees without lesions, endocervical curettage performed, cervical biopsies taken at 2, 6, and 12 o'clock, specimen labelled and sent to pathology, and hemostasis achieved with silver nitrate.   Vaginal inspection: normal without visible lesions. Vulvar colposcopy: vulvar colposcopy not performed.   Physical Exam   Specimens: ECC and Cervical Biopsies  Complications: none.  Plan: Specimens labelled and sent to Pathology. Will base further treatment on Pathology findings. Treatment options discussed with patient. Post biopsy instructions given to patient. Return to discuss Pathology results in 2 weeks.   Brock Bad, MD 12/17/2020 4:29 PM

## 2020-12-21 ENCOUNTER — Encounter: Payer: Self-pay | Admitting: Obstetrics

## 2020-12-21 LAB — SURGICAL PATHOLOGY

## 2020-12-22 DIAGNOSIS — R2231 Localized swelling, mass and lump, right upper limb: Secondary | ICD-10-CM | POA: Diagnosis not present

## 2020-12-31 ENCOUNTER — Telehealth (INDEPENDENT_AMBULATORY_CARE_PROVIDER_SITE_OTHER): Payer: BLUE CROSS/BLUE SHIELD | Admitting: Obstetrics

## 2020-12-31 ENCOUNTER — Encounter: Payer: Self-pay | Admitting: Obstetrics

## 2020-12-31 DIAGNOSIS — N870 Mild cervical dysplasia: Secondary | ICD-10-CM

## 2020-12-31 DIAGNOSIS — R87810 Cervical high risk human papillomavirus (HPV) DNA test positive: Secondary | ICD-10-CM

## 2020-12-31 NOTE — Progress Notes (Signed)
GYNECOLOGY VIRTUAL VISIT ENCOUNTER NOTE  Provider location: [facility] for [facility]   Patient location: Home  I connected with Becky Mclaughlin on 12/31/20 at 11:15 AM EST by MyChart Video Encounter and verified that I am speaking with the correct person using two identifiers.   I discussed the limitations, risks, security and privacy concerns of performing an evaluation and management service virtually and the availability of in person appointments. I also discussed with the patient that there may be a patient responsible charge related to this service. The patient expressed understanding and agreed to proceed.   History:  Becky Mclaughlin is a 20 y.o. G76P1001 female being evaluated today for results and management recommendations form colposcopic biopsies. She denies any abnormal vaginal discharge, bleeding, pelvic pain or other concerns.       Past Medical History:  Diagnosis Date   Acid reflux    Anxiety    Kidney disease    Past Surgical History:  Procedure Laterality Date   CESAREAN SECTION N/A 07/31/2020   Procedure: CESAREAN SECTION;  Surgeon: Kathrynn Running, MD;  Location: [facility] LD [facility]S;  Service: Obstetrics;  Laterality: N/A;   MOUTH SURGERY  2018   The following portions of the patient's history were reviewed and updated as appropriate: allergies, current medications, past family history, past medical history, past social history, past surgical history and problem list.   Health Maintenance:  LGSIL pap and positive HRHPV on 11-17-20.    Review of Systems:  Pertinent items noted in HPI and remainder of comprehensive ROS otherwise negative.  Physical Exam:   General:  Alert, oriented and cooperative. Patient appears to be in no acute distress.  Mental Status: Normal mood and affect. Normal behavior. Normal judgment and thought content.   Respiratory: Normal respiratory effort, no problems with respiration noted  Rest of physical exam deferred due to type of  encounter  Labs and Imaging Results for orders placed or performed in visit on 12/17/20 (from the past 336 hour(s))  Surgical pathology( [place]/ [facility])   Collection Time: 12/17/20  4:29 PM  Result Value Ref Range   SURGICAL PATHOLOGY      SURGICAL PATHOLOGY CASE: [id number] PATIENT: Becky Mclaughlin Surgical Pathology Report     Clinical History: LGSIL (cm)     FINAL MICROSCOPIC DIAGNOSIS:  A. ENDOCERVIX, CURETTAGE: - Benign endocervical mucosa.  B. CERVIX, BIOPSY: - Low-grade squamous intraepithelial lesion, CIN-1 (low grade dysplasia).    GROSS DESCRIPTION:  A: The specimen is received in formalin and consists of a 1.0 x 0.9 x 0.4 cm aggregate of tan soft tissue and mucus.  The specimen is entirely submitted in 1 cassette.  B: The specimen is received in formalin and consists of 2 pieces of tan soft tissue, measuring 0.3 and 0.5 cm in greatest dimension.  The specimen is entirely submitted in 1 cassette.  Lovey Newcomer 12/18/2020)    Final Diagnosis performed by Jimmy Picket, MD.   Electronically signed 12/21/2020 Technical component performed at [facility]. [facility], [address] [place], [place] [address].  Professional component performed at [facility],  [address], [place], [place] [address].  Immunohistochemistry Technical component (if applicable) was performed at [facility]. [address], [place], [place] [address].   IMMUNOHISTOCHEMISTRY DISCLAIMER (if applicable): Some of these immunohistochemical stains may have been developed and the performance characteristics determine by [facility]. Some may not have been cleared or approved by the U.S. Food  and Drug Administration. The FDA has determined that such clearance or approval is not necessary. This test is used for clinical purposes. It should not be regarded as investigational or for research. This laboratory  is certified under the Clinical Laboratory Improvement Amendments of 1988 (CLIA-88) as qualified to perform high complexity clinical laboratory testing.  The controls stained appropriately.   POCT urine pregnancy   Collection Time: 12/17/20  4:40 PM  Result Value Ref Range   Preg Test, Ur Negative Negative   No results found.     Assessment and Plan:     1. Dysplasia of cervix, low grade (CIN 1) - repeat pap in 1 year  2. Cervical high risk HPV (human papillomavirus) test positive - patient is interested in getting the Gardisil HPV Vaccination       I discussed the assessment and treatment plan with the patient. The patient was provided an opportunity to ask questions and all were answered. The patient agreed with the plan and demonstrated an understanding of the instructions.   The patient was advised to call back or seek an in-person evaluation/go to the ED if the symptoms worsen or if the condition fails to improve as anticipated.  I have spent a total of 15 minutes non-face-to-face time, excluding clinical staff time, reviewing notes and preparing to see patient, ordering tests and/or medications, and counseling the patient.    Coral Ceo, MD Center for [facility], [facility]  12/31/20

## 2020-12-31 NOTE — Progress Notes (Signed)
Follow up colpo.

## 2021-01-12 DIAGNOSIS — R519 Headache, unspecified: Secondary | ICD-10-CM | POA: Diagnosis not present

## 2021-01-12 DIAGNOSIS — F410 Panic disorder [episodic paroxysmal anxiety] without agoraphobia: Secondary | ICD-10-CM | POA: Diagnosis not present

## 2021-01-12 DIAGNOSIS — R42 Dizziness and giddiness: Secondary | ICD-10-CM | POA: Diagnosis not present

## 2021-01-12 DIAGNOSIS — F411 Generalized anxiety disorder: Secondary | ICD-10-CM | POA: Diagnosis not present

## 2021-01-21 ENCOUNTER — Ambulatory Visit: Payer: Medicaid Other | Admitting: Cardiology

## 2021-01-28 ENCOUNTER — Ambulatory Visit: Payer: Medicaid Other

## 2021-02-03 ENCOUNTER — Ambulatory Visit: Payer: Medicaid Other

## 2021-02-18 ENCOUNTER — Encounter: Payer: Self-pay | Admitting: Cardiology

## 2021-02-18 ENCOUNTER — Ambulatory Visit: Payer: Medicaid Other | Admitting: Cardiology

## 2021-02-18 ENCOUNTER — Other Ambulatory Visit: Payer: Self-pay

## 2021-02-18 VITALS — BP 106/71 | HR 90 | Temp 98.2°F | Resp 16 | Ht 69.0 in | Wt 125.0 lb

## 2021-02-18 DIAGNOSIS — R42 Dizziness and giddiness: Secondary | ICD-10-CM

## 2021-02-18 NOTE — Progress Notes (Signed)
Follow up visit  Subjective:   Becky Mclaughlin, female    DOB: February 02, 2001, 20 y.o.   MRN: [id number]  HPI  Chief Complaint  Patient presents with   New Patient (Initial Visit)   Loss of Consciousness    20 y.o. Caucasian female with syncope  Patient was last seen in 2020 for similar complaints. Echocardiogram then was normal. She did not undergo stress test then, as recommended.   Patient gave birth to her first child in 07/2020 through C-section. A month later, she had an episode of lightheadedness and brief loss of consciousness after standing up. She denies chest pain, shortness of breath, palpitations, leg edema, orthopnea, PND. She drinks 1-2 bottles of 500 cc water. She has had similar episodes even prior to pregnancy.    Current Outpatient Medications on File Prior to Visit  Medication Sig Dispense Refill   acetaminophen (TYLENOL) 500 MG tablet Take 2 tablets (1,000 mg total) by mouth every 6 (six) hours as needed for mild pain (temperature > 101.5.). (Patient not taking: Reported on 11/17/2020)     amphetamine-dextroamphetamine (ADDERALL) 20 MG tablet Take 1 tablet by mouth 2 (two) times daily.     Blood Pressure Monitoring (BLOOD PRESSURE KIT) DEVI 1 kit by Does not apply route as needed. (Patient not taking: Reported on 11/17/2020) 1 each 0   coconut oil OIL Apply 1 application topically as needed. (Patient not taking: No sig reported)  0   hydrOXYzine (ATARAX/VISTARIL) 25 MG tablet Take 25 mg by mouth 3 (three) times daily.     ibuprofen (ADVIL) 600 MG tablet Take 1 tablet (600 mg total) by mouth every 6 (six) hours as needed. (Patient not taking: No sig reported) 30 tablet 0   oxyCODONE (OXY IR/ROXICODONE) 5 MG immediate release tablet Take 1 tablet (5 mg total) by mouth every 6 (six) hours as needed for moderate pain. (Patient not taking: No sig reported) 15 tablet 0   Prenatal Vit-Fe Fumarate-FA (MULTIVITAMIN-PRENATAL) 27-0.8 MG TABS tablet Take 1 tablet by mouth in the  morning. (Patient not taking: Reported on 11/17/2020)     Current Facility-Administered Medications on File Prior to Visit  Medication Dose Route Frequency Provider Last Rate Last Admin   lactated ringers infusion   Intravenous Continuous Chancy Milroy, MD   Stopped at 07/31/20 1635   terbutaline (BRETHINE) injection 0.25 mg  0.25 mg Subcutaneous Once Janet Berlin, MD        Cardiovascular & other pertient studies:  EKG 02/18/2021: Sinus rhythm 71 bpm  Low voltage in limb leads Nonspecific T-abnormality Unchanged compared to previous EKG   Echocardiogram 11/07/2018:  Left ventricle cavity is normal in size. Normal left ventricular wall  thickness. Low normal LVEF around 50%. Normal global wall motion.  Indeterminate diastolic filling pattern.  No significant valvular abnormality.  normal right atrial pressure.    Recent labs: 10/28/2020: TSH 1.1 normal   Review of Systems  Cardiovascular:  Positive for syncope. Negative for chest pain, dyspnea on exertion, leg swelling and palpitations.        Vitals:   02/18/21 1403  BP: 106/71  Pulse: 90  Temp: 98.2 F (36.8 C)  SpO2: 97%   Orthostatic VS for the past 72 hrs (Last 3 readings):  Orthostatic BP Patient Position BP Location Cuff Size Orthostatic Pulse  02/18/21 1417 99/70 Standing Left Arm Normal 108  02/18/21 1416 111/73 Sitting Left Arm Normal 96  02/18/21 1415 116/70 Supine Left Arm Normal 77  Body mass index is 18.46 kg/m. Filed Weights   02/18/21 1403  Weight: 125 lb (56.7 kg)     Objective:   Physical Exam Vitals and nursing note reviewed.  Constitutional:      General: She is not in acute distress. Neck:     Vascular: No JVD.  Cardiovascular:     Rate and Rhythm: Normal rate and regular rhythm.     Heart sounds: Normal heart sounds. No murmur heard. Pulmonary:     Effort: Pulmonary effort is normal.     Breath sounds: Normal breath sounds. No wheezing or rales.           Assessment & Recommendations:   20 y.o. Caucasian female with syncope  Syncope: Standing drip in BP, not amounting to orthostatic hypotension. I think her episode back in summer 2022 was still most likely vasovagal related to orthostatic hypotension. Recommend liberal hydration, compression stockings. No medication necessary.  F/u in 3 months    Nigel Mormon, MD Pager: [phone] Office: [phone]

## 2021-02-20 ENCOUNTER — Encounter: Payer: Self-pay | Admitting: Cardiology

## 2021-04-15 DIAGNOSIS — Z3480 Encounter for supervision of other normal pregnancy, unspecified trimester: Secondary | ICD-10-CM | POA: Insufficient documentation

## 2021-04-16 ENCOUNTER — Ambulatory Visit (INDEPENDENT_AMBULATORY_CARE_PROVIDER_SITE_OTHER): Payer: Medicaid Other

## 2021-04-16 DIAGNOSIS — Z3480 Encounter for supervision of other normal pregnancy, unspecified trimester: Secondary | ICD-10-CM

## 2021-04-16 DIAGNOSIS — Z3481 Encounter for supervision of other normal pregnancy, first trimester: Secondary | ICD-10-CM

## 2021-04-16 DIAGNOSIS — Z3A00 Weeks of gestation of pregnancy not specified: Secondary | ICD-10-CM

## 2021-04-16 NOTE — Progress Notes (Signed)
New OB Intake  I connected with  Becky Mclaughlin on 04/16/21 at  9:00 AM EST by telephone Video Visit and verified that I am speaking with the correct person using two identifiers. Nurse is located at [facility] and pt is located at HOME.  I discussed the limitations, risks, security and privacy concerns of performing an evaluation and management service by telephone and the availability of in person appointments. I also discussed with the patient that there may be a patient responsible charge related to this service. The patient expressed understanding and agreed to proceed.  I explained I am completing New OB Intake today. We discussed her EDD of 10/27/2021 that is based on LMP of 01/20/2021. Pt is G2/P1. I reviewed her allergies, medications, Medical/Surgical/OB history, and appropriate screenings. I informed her of [facility] services. Based on history, this is a/an  uncomplicated pregnancy  .   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   MyChart/Babyscripts MyChart access verified. I explained pt will have some visits in office and some virtually. Babyscripts instructions given and order placed. Patient  verifies receipt of registration text/e-mail. Account successfully created and app downloaded.  Blood Pressure Cuff  Blood pressure cuff ordered for patient to pick-up from [employer]. Explained after first prenatal appt pt will check weekly and document in Babyscripts.  Weight scale: Patient does / does not  have weight scale. Weight scale ordered for patient to pick up from [employer].   Anatomy US Explained first scheduled [place] will be around 19 weeks. Anatomy US scheduled Pt notified to arrive at 15 minutes before appt. Scheduled AFP lab only appointment if CenteringPregnancy pt for same day as anatomy US.   Labs Discussed Avelina Laine genetic screening with patient. Would like both Panorama and Horizon drawn at new OB visit.Also if interested in genetic testing, tell patient she will need AFP 15-21 weeks to complete genetic testing .Routine prenatal labs needed.  Covid Vaccine Patient has not covid vaccine.   Is patient a CenteringPregnancy candidate? Declined   Is patient a Mom+Baby Combined Care candidate? Declined     Informed patient of [facility] website  and placed link in her AVS.   Social Determinants of Health Food Insecurity: Patient denies food insecurity. WIC Referral: Patient is interested in referral to [facility].  Transportation: Patient denies transportation needs. Childcare: Discussed no children allowed at ultrasound appointments. Offered childcare services; patient declines childcare services at this time.  Send link to Pregnancy Navigators   Placed OB Box on problem list and updated  First visit review I reviewed new OB appt with pt. I explained she will have a pelvic exam, ob bloodwork with genetic screening, and PAP smear. Explained pt will be seen by Scheryl Darter at first visit; encounter routed  to appropriate provider. Explained that patient will be seen by pregnancy navigator following visit with provider. [facility] information placed in AVS.   Maretta Bees, RMA 04/16/2021  9:12 AM

## 2021-04-16 NOTE — Progress Notes (Signed)
Agree with nurses's documentation of this patient's clinic encounter.  Debara Kamphuis L, MD  

## 2021-04-23 ENCOUNTER — Other Ambulatory Visit: Payer: Self-pay

## 2021-04-23 ENCOUNTER — Encounter: Payer: Self-pay | Admitting: Obstetrics & Gynecology

## 2021-04-23 ENCOUNTER — Other Ambulatory Visit (HOSPITAL_COMMUNITY)
Admission: RE | Admit: 2021-04-23 | Discharge: 2021-04-23 | Disposition: A | Discharge status: Home / Self Care | Payer: BC Managed Care – PPO | Source: Ambulatory Visit | Attending: Obstetrics & Gynecology | Admitting: Obstetrics & Gynecology

## 2021-04-23 ENCOUNTER — Ambulatory Visit (INDEPENDENT_AMBULATORY_CARE_PROVIDER_SITE_OTHER): Payer: BLUE CROSS/BLUE SHIELD | Admitting: Obstetrics & Gynecology

## 2021-04-23 VITALS — BP 114/73 | HR 88 | Wt 135.4 lb

## 2021-04-23 DIAGNOSIS — R87612 Low grade squamous intraepithelial lesion on cytologic smear of cervix (LGSIL): Secondary | ICD-10-CM

## 2021-04-23 DIAGNOSIS — Z3480 Encounter for supervision of other normal pregnancy, unspecified trimester: Secondary | ICD-10-CM

## 2021-04-23 DIAGNOSIS — O09511 Supervision of elderly primigravida, first trimester: Secondary | ICD-10-CM | POA: Diagnosis not present

## 2021-04-23 NOTE — Progress Notes (Signed)
?  Subjective:  ? ? Becky Mclaughlin is a G2P1001 [redacted]w[redacted]d being seen today for her first obstetrical visit.  Her obstetrical history is significant for  recent term delivery . Patient does not intend to breast feed. Pregnancy history fully reviewed. ? ?Patient reports nausea. ? ?Vitals:  ? 04/23/21 0928  ?BP: 114/73  ?Pulse: 88  ?Weight: 135 lb 6.4 oz (61.4 kg)  ? ? ?HISTORY: ?OB History  ?Gravida Para Term Preterm AB Living  ?2 1 1     1   ?SAB IAB Ectopic Multiple Live Births  ?      0 1  ?  ?# Outcome Date GA Lbr Len/2nd Weight Sex Delivery Anes PTL Lv  ?2 Current           ?1 Term 07/31/20 [redacted]w[redacted]d  6 lb 13.9 oz (3.116 kg) F CS-LTranv Spinal  LIV  ? ?Past Medical History:  ?Diagnosis Date  ? Acid reflux   ? Anxiety   ? Kidney disease   ? Vaginal Pap smear, abnormal   ? ?Past Surgical History:  ?Procedure Laterality Date  ? CESAREAN SECTION N/A 07/31/2020  ? Procedure: CESAREAN SECTION;  Surgeon: 09/30/2020, MD;  Location: [facility] LD [facility]S;  Service: Obstetrics;  Laterality: N/A;  ? MOUTH SURGERY  2018  ? MULTIPLE TOOTH EXTRACTIONS    ? ?Family History  ?Problem Relation Age of Onset  ? Hypertension Mother   ?     "had hole in her heart"  ? Hypertension Father   ? Stroke Cousin   ?     At age 43., used to vape  ? ? ? ?Exam  ? ? ?Uterus:     ?Pelvic Exam:   ? Perineum: No Hemorrhoids  ? Vulva: normal  ? Vagina:  normal mucosa  ? pH:    ? Cervix: no lesions  ? Adnexa: normal adnexa  ? Bony Pelvis: average  ?System: Breast:  deferred  ? Skin: normal coloration and turgor, no rashes ?  ? Neurologic: oriented, normal mood  ? Extremities: normal strength, tone, and muscle mass  ? HEENT PERRLA and extra ocular movement intact  ? Mouth/Teeth mucous membranes moist, pharynx normal without lesions  ? Neck supple  ? Cardiovascular: regular rate and rhythm  ? Respiratory:  appears well, vitals normal, no respiratory distress, acyanotic, normal RR  ? Abdomen: soft, non-tender; bowel sounds normal; no masses,  no organomegaly  ?  Urinary: urethral meatus normal  ? ? ?  ?Assessment:  ? ? Pregnancy: G2P1001 ?Patient Active Problem List  ? Diagnosis Date Noted  ? Supervision of other normal pregnancy, antepartum 04/15/2021  ? Abnormal Pap smear of cervix 11/24/2020  ? Breech presentation, no version 07/23/2020  ? Depressive disorder 02/13/2019  ? Generalized anxiety disorder with panic attacks 11/29/2018  ? Precordial pain 10/22/2018  ? Gastroesophageal reflux disease without esophagitis 05/09/2018  ? 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 ?  ? ?  ?Plan:  ? ?  ?Initial labs drawn. ?Prenatal vitamins. ?Problem list reviewed and updated. ?Genetic Screening discussed : ordered. ? Ultrasound discussed; fetal survey: ordered. ? Follow up in 4 weeks. ?50% of 30 min visit spent on counseling and coordination of care.  ?Patient is TOLAC candidate, cesarean section done for breech  ? ?11/20/2010 ?04/23/2021 ? ? ?

## 2021-04-23 NOTE — Progress Notes (Signed)
Patient presents for new OB. Has no concerns today. ?

## 2021-04-24 LAB — CBC/D/PLT+RPR+RH+ABO+RUBIGG...
Antibody Screen: NEGATIVE
Basophils Absolute: 0 10*3/uL (ref 0.0–0.2)
Basos: 0 %
EOS (ABSOLUTE): 0 10*3/uL (ref 0.0–0.4)
Eos: 0 %
HCV Ab: NONREACTIVE
HIV Screen 4th Generation wRfx: NONREACTIVE
Hematocrit: 43.7 % (ref 34.0–46.6)
Hemoglobin: 14.6 g/dL (ref 11.1–15.9)
Hepatitis B Surface Ag: NEGATIVE
Immature Grans (Abs): 0 10*3/uL (ref 0.0–0.1)
Immature Granulocytes: 0 %
Lymphocytes Absolute: 2.2 10*3/uL (ref 0.7–3.1)
Lymphs: 21 %
MCH: 31.1 pg (ref 26.6–33.0)
MCHC: 33.4 g/dL (ref 31.5–35.7)
MCV: 93 fL (ref 79–97)
Monocytes Absolute: 0.7 10*3/uL (ref 0.1–0.9)
Monocytes: 7 %
Neutrophils Absolute: 7.5 10*3/uL — ABNORMAL HIGH (ref 1.4–7.0)
Neutrophils: 72 %
Platelets: 275 10*3/uL (ref 150–450)
RBC: 4.69 x10E6/uL (ref 3.77–5.28)
RDW: 11.8 % (ref 11.7–15.4)
RPR Ser Ql: NONREACTIVE
Rh Factor: NEGATIVE
Rubella Antibodies, IGG: 6.29 index (ref >0.99)
WBC: 10.6 10*3/uL (ref 3.4–10.8)

## 2021-04-24 LAB — HCV INTERPRETATION

## 2021-04-25 LAB — URINE CULTURE, OB REFLEX

## 2021-04-25 LAB — CULTURE, OB URINE

## 2021-04-26 LAB — CERVICOVAGINAL ANCILLARY ONLY
Chlamydia: NEGATIVE
Comment: NEGATIVE
Comment: NORMAL
Neisseria Gonorrhea: NEGATIVE

## 2021-05-03 ENCOUNTER — Encounter: Payer: Self-pay | Admitting: Obstetrics & Gynecology

## 2021-05-19 ENCOUNTER — Ambulatory Visit: Payer: Medicaid Other | Admitting: Cardiology

## 2021-05-19 NOTE — Progress Notes (Deleted)
? ?  Follow up visit ? ?Subjective:  ? ?Becky Mclaughlin, female    DOB: May 16, 2000, 21 y.o.   MRN: [id number] ? ?HPI ? ?No chief complaint on file. ? ? ?21 y.o. Caucasian female with syncope ? ?*** ?Patient was last seen in 2020 for similar complaints. Echocardiogram then was normal. She did not undergo stress test then, as recommended.  ? ?*** ?Patient gave birth to her first child in 07/2020 through C-section. A month later, she had an episode of lightheadedness and brief loss of consciousness after standing up. She denies chest pain, shortness of breath, palpitations, leg edema, orthopnea, PND. She drinks 1-2 bottles of 500 cc water. She has had similar episodes even prior to pregnancy.  ? ? ?Current Outpatient Medications on File Prior to Visit  ?Medication Sig Dispense Refill  ? amphetamine-dextroamphetamine (ADDERALL) 20 MG tablet Take 1 tablet by mouth 2 (two) times daily.    ? Prenatal Vit-Fe Fumarate-FA (MULTIVITAMIN-PRENATAL) 27-0.8 MG TABS tablet Take 1 tablet by mouth in the morning.    ? ?No current facility-administered medications on file prior to visit.  ? ? ?Cardiovascular & other pertient studies: ? ?EKG 02/18/2021: ?Sinus rhythm 71 bpm  ?Low voltage in limb leads ?Nonspecific T-abnormality ?Unchanged compared to previous EKG ? ? ?Echocardiogram 11/07/2018:  ?Left ventricle cavity is normal in size. Normal left ventricular wall  ?thickness. Low normal LVEF around 50%. Normal global wall motion.  ?Indeterminate diastolic filling pattern.  ?No significant valvular abnormality.  ?normal right atrial pressure.  ? ? ?Recent labs: ?10/28/2020: ?TSH 1.1 normal ? ? ?Review of Systems  ?Cardiovascular:  Positive for syncope. Negative for chest pain, dyspnea on exertion, leg swelling and palpitations.  ? ?   ? ? ?There were no vitals filed for this visit. ? ?No data found. ? ? ? ?There is no height or weight on file to calculate BMI. ?There were no vitals filed for this visit. ? ? ? ?Objective:  ? Physical  Exam ?Vitals and nursing note reviewed.  ?Constitutional:   ?   General: She is not in acute distress. ?Neck:  ?   Vascular: No JVD.  ?Cardiovascular:  ?   Rate and Rhythm: Normal rate and regular rhythm.  ?   Heart sounds: Normal heart sounds. No murmur heard. ?Pulmonary:  ?   Effort: Pulmonary effort is normal.  ?   Breath sounds: Normal breath sounds. No wheezing or rales.  ? ? ? ? ? ?   ?Assessment & Recommendations:  ? ?21 y.o. Caucasian female with syncope ? ?*** ?Syncope: ?Standing drop in BP, not amounting to orthostatic hypotension. I think her episode back in summer 2022 was still most likely vasovagal related to orthostatic hypotension. Recommend liberal hydration, compression stockings. No medication necessary. ? ?F/u in 3 months ? ? ? ?Elder Negus, MD ?Pager: [phone] ?Office: [phone] ?

## 2021-05-19 NOTE — Progress Notes (Signed)
? ?  PRENATAL VISIT NOTE ? ?Subjective:  ?Becky Mclaughlin is a 21 y.o. G2P1001 at [redacted]w[redacted]d being seen today for ongoing prenatal care.  She is currently monitored for the following issues for this low-risk pregnancy and has Precordial pain; Rh negative status during pregnancy; Depressive disorder; Gastroesophageal reflux disease without esophagitis; Generalized anxiety disorder with panic attacks; Henoch-Schonlein purpura ([place]); History of C-section; Abnormal Pap smear of cervix; and Supervision of other normal pregnancy, antepartum on their problem list. ? ?Patient reports  "twisting" in her belly intermittently .  Contractions: Not present. Vag. Bleeding: None.  Movement: Present. Denies leaking of fluid.  ? ?The following portions of the patient's history were reviewed and updated as appropriate: allergies, current medications, past family history, past medical history, past social history, past surgical history and problem list.  ? ?Objective:  ? ?Vitals:  ? 05/21/21 1101  ?BP: 109/75  ?Pulse: 80  ?Weight: 139 lb (63 kg)  ? ? ?Fetal Status: Fetal Heart Rate (bpm): 145   Movement: Present    ? ?General:  Alert, oriented and cooperative. Patient is in no acute distress.  ?Skin: Skin is warm and dry. No rash noted.   ?Cardiovascular: Normal heart rate noted  ?Respiratory: Normal respiratory effort, no problems with respiration noted  ?Abdomen: Soft, gravid, appropriate for gestational age.  Pain/Pressure: Present     ?Pelvic: Cervical exam deferred        ?Extremities: Normal range of motion.     ?Mental Status: Normal mood and affect. Normal behavior. Normal judgment and thought content.  ? ?Assessment and Plan:  ?Pregnancy: G2P1001 at [redacted]w[redacted]d ?1. Supervision of other normal pregnancy, antepartum ?- Anatomy on 4/12 ?- LR NIPS, female ? ?2. Low grade squamous intraepithelial lesion on cytologic smear of cervix (LGSIL) ?- Pap LSIL 10/2020 with colpo c/w CIN1 on biopsy ? ?3. Rh negative status during pregnancy in second  trimester ?- Rhogam per routine ? ?4. History of C-section ?- Desires repeat c-section. Would prefer Dr. Si Raider is possible.  ?- Reviewed without complication timing of delivery would be 39w ? ? ?Preterm labor symptoms and general obstetric precautions including but not limited to vaginal bleeding, contractions, leaking of fluid and fetal movement were reviewed in detail with the patient. ?Please refer to After Visit Summary for other counseling recommendations.  ? ?Return in about 4 weeks (around 06/18/2021) for OB VISIT, MD or APP. ? ?Future Appointments  ?Date Time Provider [place]  ?06/02/2021  1:30 PM [facility] NURSE [facility]  ?06/02/2021  1:45 PM [facility] US5 [facility]  ?06/04/2021  2:30 PM Patwardhan, Reynold Bowen, MD [facility] None  ? ? ?Radene Gunning, MD ?

## 2021-05-21 ENCOUNTER — Encounter: Payer: Self-pay | Admitting: Obstetrics and Gynecology

## 2021-05-21 ENCOUNTER — Ambulatory Visit (INDEPENDENT_AMBULATORY_CARE_PROVIDER_SITE_OTHER): Payer: Medicaid Other | Admitting: Obstetrics and Gynecology

## 2021-05-21 VITALS — BP 109/75 | HR 80 | Wt 139.0 lb

## 2021-05-21 DIAGNOSIS — O26892 Other specified pregnancy related conditions, second trimester: Secondary | ICD-10-CM

## 2021-05-21 DIAGNOSIS — R87612 Low grade squamous intraepithelial lesion on cytologic smear of cervix (LGSIL): Secondary | ICD-10-CM

## 2021-05-21 DIAGNOSIS — Z3480 Encounter for supervision of other normal pregnancy, unspecified trimester: Secondary | ICD-10-CM

## 2021-05-21 DIAGNOSIS — Z98891 History of uterine scar from previous surgery: Secondary | ICD-10-CM

## 2021-05-21 DIAGNOSIS — Z6791 Unspecified blood type, Rh negative: Secondary | ICD-10-CM

## 2021-05-21 NOTE — Progress Notes (Signed)
Pt is having some "twisting" feeling in her abd.  ?Pt is feeling occ light FM. ? ?

## 2021-06-02 ENCOUNTER — Ambulatory Visit: Payer: BC Managed Care – PPO | Admitting: *Deleted

## 2021-06-02 ENCOUNTER — Ambulatory Visit: Payer: BC Managed Care – PPO | Attending: Obstetrics & Gynecology

## 2021-06-02 VITALS — BP 117/75 | HR 87

## 2021-06-02 DIAGNOSIS — O34219 Maternal care for unspecified type scar from previous cesarean delivery: Secondary | ICD-10-CM | POA: Diagnosis not present

## 2021-06-02 DIAGNOSIS — Z3480 Encounter for supervision of other normal pregnancy, unspecified trimester: Secondary | ICD-10-CM | POA: Diagnosis not present

## 2021-06-02 DIAGNOSIS — Z363 Encounter for antenatal screening for malformations: Secondary | ICD-10-CM | POA: Insufficient documentation

## 2021-06-02 DIAGNOSIS — Z3A19 19 weeks gestation of pregnancy: Secondary | ICD-10-CM | POA: Insufficient documentation

## 2021-06-03 ENCOUNTER — Other Ambulatory Visit: Payer: Self-pay | Admitting: *Deleted

## 2021-06-03 DIAGNOSIS — O34219 Maternal care for unspecified type scar from previous cesarean delivery: Secondary | ICD-10-CM

## 2021-06-03 DIAGNOSIS — Z362 Encounter for other antenatal screening follow-up: Secondary | ICD-10-CM

## 2021-06-04 ENCOUNTER — Ambulatory Visit: Payer: Medicaid Other | Admitting: Cardiology

## 2021-06-11 DIAGNOSIS — L739 Follicular disorder, unspecified: Secondary | ICD-10-CM | POA: Diagnosis not present

## 2021-06-11 DIAGNOSIS — L219 Seborrheic dermatitis, unspecified: Secondary | ICD-10-CM | POA: Diagnosis not present

## 2021-06-11 DIAGNOSIS — L720 Epidermal cyst: Secondary | ICD-10-CM | POA: Diagnosis not present

## 2021-06-11 DIAGNOSIS — B070 Plantar wart: Secondary | ICD-10-CM | POA: Diagnosis not present

## 2021-06-16 DIAGNOSIS — F411 Generalized anxiety disorder: Secondary | ICD-10-CM | POA: Diagnosis not present

## 2021-06-16 DIAGNOSIS — Z3A21 21 weeks gestation of pregnancy: Secondary | ICD-10-CM | POA: Diagnosis not present

## 2021-06-16 DIAGNOSIS — F410 Panic disorder [episodic paroxysmal anxiety] without agoraphobia: Secondary | ICD-10-CM | POA: Diagnosis not present

## 2021-06-16 DIAGNOSIS — R41840 Attention and concentration deficit: Secondary | ICD-10-CM | POA: Diagnosis not present

## 2021-06-18 ENCOUNTER — Ambulatory Visit (INDEPENDENT_AMBULATORY_CARE_PROVIDER_SITE_OTHER): Payer: BC Managed Care – PPO

## 2021-06-18 VITALS — BP 111/69 | HR 79 | Wt 147.0 lb

## 2021-06-18 DIAGNOSIS — Z98891 History of uterine scar from previous surgery: Secondary | ICD-10-CM

## 2021-06-18 DIAGNOSIS — Z3A21 21 weeks gestation of pregnancy: Secondary | ICD-10-CM

## 2021-06-18 DIAGNOSIS — Z3480 Encounter for supervision of other normal pregnancy, unspecified trimester: Secondary | ICD-10-CM

## 2021-06-18 NOTE — Progress Notes (Signed)
? ?  LOW-RISK PREGNANCY OFFICE VISIT ? ?Patient name: Becky Mclaughlin MRN [id number]  Date of birth: 2000/04/02 ?Chief Complaint:   ?Routine Prenatal Visit ? ?Subjective:   ?Becky Mclaughlin is a 21 y.o. G3P1001 female at [redacted]w[redacted]d with an Estimated Date of Delivery: 10/27/21 being seen today for ongoing management of a low-risk pregnancy aeb has Precordial pain; Rh negative status during pregnancy; Depressive disorder; Gastroesophageal reflux disease without esophagitis; Generalized anxiety disorder with panic attacks; Henoch-Schonlein purpura (HCC); History of C-section; Abnormal Pap smear of cervix; and Supervision of other normal pregnancy, antepartum on their problem list. ? ?Patient presents today, alone, with no complaints, but concerns regarding spinal bifida.  Patient states she has not completed AFP and does not think she will.  However, she is concerned that all views of fetal spine was not visualized during ultrasound.  Patient endorses fetal movement. Patient denies abdominal cramping or contractions.  Patient denies vaginal concerns including abnormal discharge, leaking of fluid, and bleeding. No issues with urination, constipation, or diarrhea.  ? Contractions: Not present. Vag. Bleeding: None.  Movement: Present. ? ?Reviewed past medical,surgical, social, obstetrical and family history as well as problem list, medications and allergies. ? ?Objective  ? ?Vitals:  ? 06/18/21 1114  ?BP: 111/69  ?Pulse: 79  ?Weight: 147 lb (66.7 kg)  ?Body mass index is 21.71 kg/m?.  ?Total Weight Gain:17 lb (7.711 kg) ? ?  ?     Physical Examination:  ? General appearance: Well appearing, and in no distress ? Mental status: Alert, oriented to person, place, and time ? Skin: Warm & dry ? Cardiovascular: Normal heart rate noted ? Respiratory: Normal respiratory effort, no distress ? Abdomen: Soft, gravid, nontender, AGA with   ? Pelvic: Cervical exam deferred          ? Extremities: Edema: None ? ?Fetal Status: Fetal Heart Rate (bpm):  150  Movement: Present  ? ?No results found for this or any previous visit (from the past 24 hour(s)).  ?Assessment & Plan:  ?Low-risk pregnancy of a 21 y.o., G2P1001 at [redacted]w[redacted]d with an Estimated Date of Delivery: 10/27/21  ? ?1. Supervision of other normal pregnancy, antepartum ?-Anticipatory guidance for upcoming appts. ?-Patient to schedule next appt in 3 weeks for an in-person visit. ? ? ?2. [redacted] weeks gestation of pregnancy ?-Doing well. ?-Reviewed concerns regarding spinal bifida. ?-Discussed reasoning for AFP and how it contributes. ?-Patient declines. ?-Reassured that ultrasounds will be performed until all views completed and if necessary further testing/evaluation as appropriate. ?-Provider also gives information regarding myelomeningocele in AVS for research.  ? ?3. History of C-section ?-Patient expresses desire for repeat ?-Informed will occur at 39 weeks unless complications arise prior to that time. ? ? ? ?  ?Meds: No orders of the defined types were placed in this encounter. ? ?Labs/procedures today:  ?Lab Orders  ?No laboratory test(s) ordered today  ?  ? ?Reviewed: Preterm labor symptoms and general obstetric precautions including but not limited to vaginal bleeding, contractions, leaking of fluid and fetal movement were reviewed in detail with the patient.  All questions were answered. ? ?Follow-up: Return in about 4 weeks (around 07/16/2021) for LROB. ? ?No orders of the defined types were placed in this encounter. ? ?Cherre Robins MSN, CNM ?06/18/2021 ? ?

## 2021-07-01 ENCOUNTER — Ambulatory Visit: Payer: BC Managed Care – PPO

## 2021-07-12 ENCOUNTER — Ambulatory Visit (INDEPENDENT_AMBULATORY_CARE_PROVIDER_SITE_OTHER): Payer: BC Managed Care – PPO

## 2021-07-12 VITALS — BP 115/65 | HR 76 | Wt 151.4 lb

## 2021-07-12 DIAGNOSIS — Z3480 Encounter for supervision of other normal pregnancy, unspecified trimester: Secondary | ICD-10-CM

## 2021-07-12 DIAGNOSIS — Z3A24 24 weeks gestation of pregnancy: Secondary | ICD-10-CM

## 2021-07-12 DIAGNOSIS — Z98891 History of uterine scar from previous surgery: Secondary | ICD-10-CM

## 2021-07-12 DIAGNOSIS — O26892 Other specified pregnancy related conditions, second trimester: Secondary | ICD-10-CM

## 2021-07-12 DIAGNOSIS — Z6791 Unspecified blood type, Rh negative: Secondary | ICD-10-CM

## 2021-07-12 DIAGNOSIS — Z3400 Encounter for supervision of normal first pregnancy, unspecified trimester: Secondary | ICD-10-CM

## 2021-07-12 NOTE — Progress Notes (Signed)
Pt reports fetal movement, denies pain.  

## 2021-07-12 NOTE — Progress Notes (Signed)
   PRENATAL VISIT NOTE  Subjective:  Becky Mclaughlin is a 21 y.o. G2P1001 at [redacted]w[redacted]d being seen today for ongoing prenatal care.  She is currently monitored for the following issues for this low-risk pregnancy and has Precordial pain; Rh negative status during pregnancy; Depressive disorder; Gastroesophageal reflux disease without esophagitis; Generalized anxiety disorder with panic attacks; Henoch-Schonlein purpura (HCC); History of C-section; Abnormal Pap smear of cervix; and Supervision of other normal pregnancy, antepartum on their problem list.  Patient reports no complaints.  Contractions: Not present. Vag. Bleeding: None.  Movement: Present. Denies leaking of fluid.   The following portions of the patient's history were reviewed and updated as appropriate: allergies, current medications, past family history, past medical history, past social history, past surgical history and problem list.   Objective:   Vitals:   07/12/21 1056  BP: 115/65  Pulse: 76  Weight: 151 lb 6.4 oz (68.7 kg)    Fetal Status: Fetal Heart Rate (bpm): 152 Fundal Height: 25 cm Movement: Present     General:  Alert, oriented and cooperative. Patient is in no acute distress.  Skin: Skin is warm and dry. No rash noted.   Cardiovascular: Normal heart rate noted  Respiratory: Normal respiratory effort, no problems with respiration noted  Abdomen: Soft, gravid, appropriate for gestational age.  Pain/Pressure: Absent     Pelvic: Cervical exam deferred        Extremities: Normal range of motion.  Edema: None  Mental Status: Normal mood and affect. Normal behavior. Normal judgment and thought content.   Assessment and Plan:  Pregnancy: G2P1001 at [redacted]w[redacted]d 1. Supervision of other normal pregnancy, antepartum - Routine OB. Doing well, no concerns today - Anticipatory guidance for upcoming appointments provided - GTT and 28 week labs next visit  2. [redacted] weeks gestation of pregnancy - Endorses active fetal movement - FH  appropriate  3. History of C-section - Desires repeat with Dr. Ashok Pall if possible  4. Rh negative status during pregnancy in second trimester - Rhogam at next visit   Preterm labor symptoms and general obstetric precautions including but not limited to vaginal bleeding, contractions, leaking of fluid and fetal movement were reviewed in detail with the patient. Please refer to After Visit Summary for other counseling recommendations.   Return in about 4 weeks (around 08/09/2021) for LOB.  Future Appointments  Date Time Provider Department Center  07/26/2021 10:30 AM [facility] NURSE [facility]  07/26/2021 10:45 AM [facility] US4 [facility]  08/09/2021  8:45 AM [facility] LAB [facility] None  08/09/2021  9:35 AM Warner Mccreedy, MD [facility] None    Brand Males, CNM

## 2021-07-15 ENCOUNTER — Telehealth: Payer: Self-pay | Admitting: Emergency Medicine

## 2021-07-15 NOTE — Telephone Encounter (Signed)
Pt calls w/ complaints of lightheadedness and dizziness when she changes from sitting or laying to standing position.  Also has c/o headache since last night with current BP134/86, P 94. Denies vision changes. Endorses + FM  Following consult with Debroah Loop, MD, Pt counseled to slowly rise from sitting to standing position, increase hydration and monitor symptoms. RC to office is symptoms persist or present to hospital if symptoms worsen.

## 2021-07-26 ENCOUNTER — Ambulatory Visit (HOSPITAL_BASED_OUTPATIENT_CLINIC_OR_DEPARTMENT_OTHER): Payer: BC Managed Care – PPO

## 2021-07-26 ENCOUNTER — Other Ambulatory Visit: Payer: Self-pay | Admitting: *Deleted

## 2021-07-26 ENCOUNTER — Ambulatory Visit (HOSPITAL_BASED_OUTPATIENT_CLINIC_OR_DEPARTMENT_OTHER): Payer: BC Managed Care – PPO | Admitting: Obstetrics and Gynecology

## 2021-07-26 ENCOUNTER — Encounter: Payer: Self-pay | Admitting: *Deleted

## 2021-07-26 ENCOUNTER — Ambulatory Visit: Payer: BC Managed Care – PPO

## 2021-07-26 ENCOUNTER — Ambulatory Visit: Payer: BC Managed Care – PPO | Attending: Maternal & Fetal Medicine | Admitting: *Deleted

## 2021-07-26 VITALS — BP 114/68 | HR 78

## 2021-07-26 DIAGNOSIS — Z3A26 26 weeks gestation of pregnancy: Secondary | ICD-10-CM

## 2021-07-26 DIAGNOSIS — O3500X0 Maternal care for (suspected) central nervous system malformation or damage in fetus, unspecified, not applicable or unspecified: Secondary | ICD-10-CM | POA: Diagnosis not present

## 2021-07-26 DIAGNOSIS — O09891 Supervision of other high risk pregnancies, first trimester: Secondary | ICD-10-CM | POA: Diagnosis not present

## 2021-07-26 DIAGNOSIS — O34219 Maternal care for unspecified type scar from previous cesarean delivery: Secondary | ICD-10-CM

## 2021-07-26 DIAGNOSIS — O09892 Supervision of other high risk pregnancies, second trimester: Secondary | ICD-10-CM | POA: Diagnosis not present

## 2021-07-26 DIAGNOSIS — Z362 Encounter for other antenatal screening follow-up: Secondary | ICD-10-CM | POA: Insufficient documentation

## 2021-07-26 DIAGNOSIS — O3509X0 Maternal care for (suspected) other central nervous system malformation or damage in fetus, not applicable or unspecified: Secondary | ICD-10-CM

## 2021-07-26 NOTE — Progress Notes (Signed)
Maternal-Fetal Medicine   Name: Becky Mclaughlin DOB: Jan 15, 2001 MRN: [id number] Referring Provider: Camelia Eng, CNM  I had the pleasure of seeing [name] [facility] at [facility]. She is G2 P1001 at 26w 5d gestation and is here for ultrasound to complete fetal anatomical survey. On cell-free fetal DNA screening, the risks of fetal aneuploidies are not increased.  Obstetrical history is significant for a term cesarean delivery in June 2022 of a female infant weighing 6 pounds and 14 ounces at birth.  Cesarean delivery was performed because of breech presentation after failed external cephalic version.  Operative note showed normal uterus with no abnormalities. Patient did not have fever or rashes in this pregnancy.  Ultrasound Fetal growth is appropriate for gestational age.  Amniotic fluid is normal and good fetal activity seen.  Fetal cardiac anatomy appears normal.  Unilateral (left) mild ventriculomegaly measuring 12 mm is seen.  Rest of the intracranial structures appear normal.  No evidence of intracranial hemorrhage or periventricular calcifications.  Ventriculomegaly I counseled the couple that ventriculomegaly can be associated with fetal chromosomal malformations, genetic syndromes, infection and normal fetus. I reassured the patient that in most cases mild isolated ventriculomegaly is not associated with adverse neurodevelopmental outcomes. Most isolated mild ventriculomegaly resolves with advancing gestation. If progression is seen, MRI may be advised to rule out structural anomalies. MRI may detect additional abnormalities not detected by ultrasound, but its detection rate improves after 24 weeks' gestation.  I discussed amniocentesis to rule out chromosomal anomalies. Amniocentesis gives information on full karyotype and microarray can also be performed to detect single gene disorders. I discussed amniocentesis procedure and possible complication of preterm  delivery (1 in 500 procedures). I reassured her that no other anomalies are seen on ultrasound. She understands that ultrasound has limitations in detecting fetal anomalies and opted not to have amniocentesis.  Ventriculomegaly can be associated with intrauterine infections including toxoplasmosis or cytomegalovirus infection (CMV). I recommended serology.  Patient had her blood drawn today at [facility]. We will communicate the results to the patient.  Recommendations -We will communicate serology results to the patient. -An appointment was made for her to return in 4 weeks for fetal growth assessment and evaluation of lateral ventricles.  Thank you for consultation.  If you have any questions or concerns, please contact me the [facility].  Consultation including face-to-face (more than 50%) counseling 30 minutes.

## 2021-07-27 ENCOUNTER — Telehealth: Payer: Self-pay | Admitting: Genetics

## 2021-07-27 LAB — CMV ANTIBODY, IGG (EIA): CMV Ab - IgG: 8.8 U/mL — ABNORMAL HIGH (ref 0.00–0.59)

## 2021-07-27 LAB — INFECT DISEASE AB IGM REFLEX 1

## 2021-07-27 LAB — TOXOPLASMA GONDII ANTIBODY, IGM: Toxoplasma Antibody- IgM: 5.5 AU/mL (ref 0.0–7.9)

## 2021-07-27 LAB — TOXOPLASMA GONDII ANTIBODY, IGG: Toxoplasma IgG Ratio: <3 IU/mL (ref 0.0–7.1)

## 2021-07-27 LAB — CMV IGM: CMV IgM Ser EIA-aCnc: <30 AU/mL (ref 0.0–29.9)

## 2021-07-27 NOTE — Telephone Encounter (Signed)
Called Becky Mclaughlin to return maternal serum infection workup results. Left voicemail with [facility] call back number.

## 2021-07-27 NOTE — Telephone Encounter (Signed)
Becky Mclaughlin returned my call regarding her maternal serum infection workup. We discussed that the results do not indicate an active CMV or Toxoplasmosis infection. Becky Mclaughlin had no questions at this time.

## 2021-08-08 NOTE — Progress Notes (Unsigned)
  Subjective:  Becky Mclaughlin is a 21 y.o. G2P1001 at [redacted]w[redacted]d being seen today for ongoing prenatal care.  She is currently monitored for the following issues for this low-risk pregnancy and has Precordial pain; Rh negative status during pregnancy; Depressive disorder; Gastroesophageal reflux disease without esophagitis; Generalized anxiety disorder with panic attacks; Henoch-Schonlein purpura (HCC); History of C-section; Abnormal Pap smear of cervix; and Supervision of other normal pregnancy, antepartum on their problem list.  Patient reports no complaints.  Contractions: Irritability. Vag. Bleeding: None.  Movement: Present. Denies leaking of fluid.   The following portions of the patient's history were reviewed and updated as appropriate: allergies, current medications, past family history, past medical history, past social history, past surgical history and problem list. Problem list updated.  Objective:   Vitals:   08/09/21 0926  BP: 115/75  Pulse: 85  Weight: 159 lb (72.1 kg)    Fetal Status: Fetal Heart Rate (bpm): 140   Movement: Present     General:  Alert, oriented and cooperative. Patient is in no acute distress.  Skin: Skin is warm and dry. No rash noted.   Cardiovascular: Normal heart rate noted  Respiratory: Normal respiratory effort, no problems with respiration noted  Abdomen: Soft, gravid, appropriate for gestational age. Pain/Pressure: Absent     Pelvic: Vag. Bleeding: None     Cervical exam deferred        Extremities: Normal range of motion.  Edema: None  Mental Status: Normal mood and affect. Normal behavior. Normal judgment and thought content.    Assessment and Plan:  Pregnancy: G2P1001 at [redacted]w[redacted]d  1. Supervision of other normal pregnancy, antepartum Doing well. No acute concerns. Fetal heart rate and fundal height appropriate. Getting 28 week labs today - follow up in 3-4 weeks  2. Rh negative status during pregnancy in second trimester Rhogam today  3.  History of C-section Last CS for Breech presentation (patient presented with SROM). Desires rLTCS. Discussed TOLAC and rLTCS in detail including risks and benefits of each. Patient at this time strongly desires rLTCS -send message to [place] to schedule rLTCS  4. [redacted] weeks gestation of pregnancy  5. Contraception management Patient reports she desires BTL. We discussed in depth regarding risk sand benefits. At this time reports she is a 100% sure she does not want any more kids. We discussed possibility/risk of regret especially since she is in her early 19s. She expresses understanding. We also discussed ultimately whether the BTL will be done will  depend on the surgeon who is on the day of her surgery and if they feel she is an appropriate candidate. We also discussed alternatives of LARCs  - would like to sign paper for BTL  today  6. Anxiety Was on Zoloft in post partum period in prior pregnancy. Currently symptoms overall well controlled. She would like to connect with IBH and also interested in potentially restarting Zoloft post partum. Was on 100mg  dose and stopped (self discontinue) in 01/2021  7. GERD Pepcid sent to pharmacy  Preterm labor symptoms and general obstetric precautions including but not limited to vaginal bleeding, contractions, leaking of fluid and fetal movement were reviewed in detail with the patient. Please refer to After Visit Summary for other counseling recommendations.  Return in about 3 weeks (around 08/30/2021) for LROB and also needs IBH appt with therapist.  10/31/2021, MD, MPH OB Fellow, Faculty Practice

## 2021-08-09 ENCOUNTER — Ambulatory Visit (INDEPENDENT_AMBULATORY_CARE_PROVIDER_SITE_OTHER): Payer: BC Managed Care – PPO | Admitting: Family Medicine

## 2021-08-09 ENCOUNTER — Other Ambulatory Visit: Payer: BC Managed Care – PPO

## 2021-08-09 VITALS — BP 115/75 | HR 85 | Wt 159.0 lb

## 2021-08-09 DIAGNOSIS — Z98891 History of uterine scar from previous surgery: Secondary | ICD-10-CM

## 2021-08-09 DIAGNOSIS — Z23 Encounter for immunization: Secondary | ICD-10-CM | POA: Diagnosis not present

## 2021-08-09 DIAGNOSIS — Z3480 Encounter for supervision of other normal pregnancy, unspecified trimester: Secondary | ICD-10-CM

## 2021-08-09 DIAGNOSIS — O26892 Other specified pregnancy related conditions, second trimester: Secondary | ICD-10-CM

## 2021-08-09 DIAGNOSIS — Z3A28 28 weeks gestation of pregnancy: Secondary | ICD-10-CM | POA: Diagnosis not present

## 2021-08-09 DIAGNOSIS — O360930 Maternal care for other rhesus isoimmunization, third trimester, not applicable or unspecified: Secondary | ICD-10-CM

## 2021-08-09 DIAGNOSIS — Z6791 Unspecified blood type, Rh negative: Secondary | ICD-10-CM | POA: Diagnosis not present

## 2021-08-09 DIAGNOSIS — O99343 Other mental disorders complicating pregnancy, third trimester: Secondary | ICD-10-CM

## 2021-08-09 DIAGNOSIS — F419 Anxiety disorder, unspecified: Secondary | ICD-10-CM

## 2021-08-09 MED ORDER — RHO D IMMUNE GLOBULIN 1500 UNIT/2ML IJ SOSY
300.0000 ug | PREFILLED_SYRINGE | Freq: Once | INTRAMUSCULAR | Status: AC
  Administered 2021-08-09: 300 ug via INTRAMUSCULAR

## 2021-08-09 MED ORDER — FAMOTIDINE 20 MG PO TABS: 20.0000 mg | ORAL_TABLET | Freq: Two times a day (BID) | ORAL | 0 refills | Status: AC | PRN

## 2021-08-10 LAB — CBC
Hematocrit: 39.8 % (ref 34.0–46.6)
Hemoglobin: 13.3 g/dL (ref 11.1–15.9)
MCH: 30.4 pg (ref 26.6–33.0)
MCHC: 33.4 g/dL (ref 31.5–35.7)
MCV: 91 fL (ref 79–97)
Platelets: 310 10*3/uL (ref 150–450)
RBC: 4.38 x10E6/uL (ref 3.77–5.28)
RDW: 12.2 % (ref 11.7–15.4)
WBC: 11.4 10*3/uL — ABNORMAL HIGH (ref 3.4–10.8)

## 2021-08-10 LAB — HIV ANTIBODY (ROUTINE TESTING W REFLEX): HIV Screen 4th Generation wRfx: NONREACTIVE

## 2021-08-10 LAB — GLUCOSE TOLERANCE, 2 HOURS W/ 1HR
Glucose, 1 hour: 104 mg/dL (ref 70–179)
Glucose, 2 hour: 105 mg/dL (ref 70–152)
Glucose, Fasting: 64 mg/dL — ABNORMAL LOW (ref 70–91)

## 2021-08-10 LAB — RPR: RPR Ser Ql: NONREACTIVE

## 2021-08-12 ENCOUNTER — Other Ambulatory Visit: Payer: Self-pay | Admitting: Family Medicine

## 2021-08-12 DIAGNOSIS — Z3480 Encounter for supervision of other normal pregnancy, unspecified trimester: Secondary | ICD-10-CM

## 2021-08-16 ENCOUNTER — Other Ambulatory Visit: Payer: Self-pay | Admitting: Advanced Practice Midwife

## 2021-08-16 ENCOUNTER — Ambulatory Visit (INDEPENDENT_AMBULATORY_CARE_PROVIDER_SITE_OTHER): Payer: BC Managed Care – PPO | Admitting: Licensed Clinical Social Worker

## 2021-08-16 DIAGNOSIS — F419 Anxiety disorder, unspecified: Secondary | ICD-10-CM | POA: Diagnosis not present

## 2021-08-16 DIAGNOSIS — O99340 Other mental disorders complicating pregnancy, unspecified trimester: Secondary | ICD-10-CM

## 2021-08-16 MED ORDER — SERTRALINE HCL 25 MG PO TABS: 25.0000 mg | ORAL_TABLET | Freq: Every day | ORAL | 0 refills | Status: DC

## 2021-08-16 MED ORDER — SERTRALINE HCL 50 MG PO TABS: 50.0000 mg | ORAL_TABLET | Freq: Every day | ORAL | 5 refills | Status: DC

## 2021-08-16 NOTE — Progress Notes (Signed)
Discussed pt recent visit with IBH with Sue Lush and pt with recently worsening anxiety, interest in beginning medications at this time. Rx for Zoloft 25 mg x 7 days, then 50 mg daily.  F/U at next prenatal visit with Dr Debroah Loop.

## 2021-08-16 NOTE — BH Specialist Note (Signed)
Integrated Behavioral Health via Telemedicine Visit  08/16/2021 Becky Mclaughlin [id number]  Number of Integrated Behavioral Health Clinician visits: 1 Session Start time: 210pm  Session End time: 233pm Total time in minutes: 33 mins via mychart video   Referring Provider: Dr. Ephriam Jenkins  Patient/Family location: Home  [facility] Provider location: [facility]  All persons participating in visit: [name] and LCSW A Felton Clinton  Types of Service: Individual psychotherapy and Video visit  I connected with Becky Mclaughlin and/or Becky Mclaughlin's n/a via  Telephone or Video Enabled Telemedicine Application  (Video is Caregility application) and verified that I am speaking with the correct person using two identifiers. Discussed confidentiality: Yes   I discussed the limitations of telemedicine and the availability of in person appointments.  Discussed there is a possibility of technology failure and discussed alternative modes of communication if that failure occurs.  I discussed that engaging in this telemedicine visit, they consent to the provision of behavioral healthcare and the services will be billed under their insurance.  Patient and/or legal guardian expressed understanding and consented to Telemedicine visit: Yes   Presenting Concerns: Patient and/or family reports the following symptoms/concerns: anxiety affecting pregnancy Duration of problem: approx 3 months ; Severity of problem: moderate  Patient and/or Family's Strengths/Protective Factors: Concrete supports in place (healthy food, safe environments, etc.)  Goals Addressed: Patient will:  Reduce symptoms of: anxiety   Increase knowledge and/or ability of: coping skills   Demonstrate ability to: Increase healthy adjustment to current life circumstances  Progress towards Goals: Ongoing  Interventions: Interventions utilized:  Supportive Counseling Standardized Assessments completed: PHQ 9  Patient and/or Family Response: Becky Mclaughlin  responded well to visit. Becky Mclaughlin trouble sleeping, overthinking, feeling on edge, rapid heart rate, and loss of interest.   Assessment: Patient currently experiencing anxiety affecting pregnancy .   Patient may benefit from integrated behavioral health .  Plan: Follow up with behavioral health clinician on : 08/31/2021 Behavioral recommendations: Begin journal writing, deep breathing and prenatal yoga, communicate needs with spouse and prioritize rest. I recommended Becky Mclaughlin engage in a social group to decrease social isolation  Referral(s): Integrated [employer] (In Clinic)  I discussed the assessment and treatment plan with the patient and/or parent/guardian. They were provided an opportunity to ask questions and all were answered. They agreed with the plan and demonstrated an understanding of the instructions.   They were advised to call back or seek an in-person evaluation if the symptoms worsen or if the condition fails to improve as anticipated.  Becky Saxon, LCSW

## 2021-08-23 ENCOUNTER — Encounter: Payer: Self-pay | Admitting: *Deleted

## 2021-08-23 ENCOUNTER — Other Ambulatory Visit: Payer: Self-pay | Admitting: *Deleted

## 2021-08-23 ENCOUNTER — Ambulatory Visit: Payer: BC Managed Care – PPO | Admitting: *Deleted

## 2021-08-23 ENCOUNTER — Ambulatory Visit: Payer: BC Managed Care – PPO | Attending: Obstetrics and Gynecology

## 2021-08-23 VITALS — BP 114/69 | HR 82

## 2021-08-23 DIAGNOSIS — Z3A30 30 weeks gestation of pregnancy: Secondary | ICD-10-CM | POA: Diagnosis not present

## 2021-08-23 DIAGNOSIS — O34219 Maternal care for unspecified type scar from previous cesarean delivery: Secondary | ICD-10-CM | POA: Diagnosis not present

## 2021-08-23 DIAGNOSIS — O3509X0 Maternal care for (suspected) other central nervous system malformation or damage in fetus, not applicable or unspecified: Secondary | ICD-10-CM

## 2021-08-23 DIAGNOSIS — O09893 Supervision of other high risk pregnancies, third trimester: Secondary | ICD-10-CM | POA: Diagnosis not present

## 2021-08-31 ENCOUNTER — Ambulatory Visit (INDEPENDENT_AMBULATORY_CARE_PROVIDER_SITE_OTHER): Payer: BC Managed Care – PPO | Admitting: Obstetrics & Gynecology

## 2021-08-31 VITALS — BP 104/66 | HR 87 | Wt 167.0 lb

## 2021-08-31 DIAGNOSIS — Z3480 Encounter for supervision of other normal pregnancy, unspecified trimester: Secondary | ICD-10-CM

## 2021-08-31 DIAGNOSIS — Z3483 Encounter for supervision of other normal pregnancy, third trimester: Secondary | ICD-10-CM

## 2021-08-31 DIAGNOSIS — Z3A31 31 weeks gestation of pregnancy: Secondary | ICD-10-CM

## 2021-08-31 DIAGNOSIS — Z98891 History of uterine scar from previous surgery: Secondary | ICD-10-CM

## 2021-08-31 NOTE — Progress Notes (Signed)
   PRENATAL VISIT NOTE  Subjective:  Becky Mclaughlin is a 21 y.o. G2P1001 at [redacted]w[redacted]d being seen today for ongoing prenatal care.  She is currently monitored for the following issues for this high-risk pregnancy and has Precordial pain; Rh negative status during pregnancy; Depressive disorder; Gastroesophageal reflux disease without esophagitis; Generalized anxiety disorder with panic attacks; Henoch-Schonlein purpura (HCC); History of C-section; Abnormal Pap smear of cervix; Supervision of other normal pregnancy, antepartum; and Anxiety in pregnancy in third trimester, antepartum on their problem list.  Patient reports no complaints.  Contractions: Not present. Vag. Bleeding: None.  Movement: Present. Denies leaking of fluid.   The following portions of the patient's history were reviewed and updated as appropriate: allergies, current medications, past family history, past medical history, past social history, past surgical history and problem list.   Objective:   Vitals:   08/31/21 1328  BP: 104/66  Pulse: 87  Weight: 167 lb (75.8 kg)    Fetal Status: Fetal Heart Rate (bpm): 141   Movement: Present     General:  Alert, oriented and cooperative. Patient is in no acute distress.  Skin: Skin is warm and dry. No rash noted.   Cardiovascular: Normal heart rate noted  Respiratory: Normal respiratory effort, no problems with respiration noted  Abdomen: Soft, gravid, appropriate for gestational age.  Pain/Pressure: Absent     Pelvic: Cervical exam deferred        Extremities: Normal range of motion.  Edema: None  Mental Status: Normal mood and affect. Normal behavior. Normal judgment and thought content.   Assessment and Plan:  Pregnancy: G2P1001 at [redacted]w[redacted]d 1. History of C-section Repeat at 39 weeks  2. Supervision of other normal pregnancy, antepartum Fetal mild ventriculomegaly  Preterm labor symptoms and general obstetric precautions including but not limited to vaginal bleeding,  contractions, leaking of fluid and fetal movement were reviewed in detail with the patient. Please refer to After Visit Summary for other counseling recommendations.   Return in about 2 weeks (around 09/14/2021).  Future Appointments  Date Time Provider Department Center  09/15/2021  2:30 PM Corlis Hove, NP [facility] None  09/27/2021  1:15 PM [facility] NURSE [facility]  09/27/2021  1:30 PM [facility] US2 [facility]  09/29/2021  2:30 PM Raelyn Mora, CNM [facility] None  10/06/2021  2:30 PM Hermina Staggers, MD [facility] None  10/13/2021  2:30 PM Adam Phenix, MD [facility] None  10/20/2021  2:30 PM Hermina Staggers, MD [facility] None  10/27/2021  2:30 PM Anyanwu, Jethro Bastos, MD [facility] None    Scheryl Darter, MD

## 2021-08-31 NOTE — Progress Notes (Signed)
ROB 31.[redacted] wks GA No concerns today Was RXd Zoloft, not taking

## 2021-09-15 ENCOUNTER — Ambulatory Visit (INDEPENDENT_AMBULATORY_CARE_PROVIDER_SITE_OTHER): Payer: BC Managed Care – PPO | Admitting: Student

## 2021-09-15 VITALS — BP 105/64 | HR 92 | Wt 169.0 lb

## 2021-09-15 DIAGNOSIS — Z3A34 34 weeks gestation of pregnancy: Secondary | ICD-10-CM

## 2021-09-15 DIAGNOSIS — Z3480 Encounter for supervision of other normal pregnancy, unspecified trimester: Secondary | ICD-10-CM

## 2021-09-15 DIAGNOSIS — O3509X0 Maternal care for (suspected) other central nervous system malformation or damage in fetus, not applicable or unspecified: Secondary | ICD-10-CM

## 2021-09-15 DIAGNOSIS — Z98891 History of uterine scar from previous surgery: Secondary | ICD-10-CM

## 2021-09-15 DIAGNOSIS — F419 Anxiety disorder, unspecified: Secondary | ICD-10-CM

## 2021-09-15 DIAGNOSIS — O99343 Other mental disorders complicating pregnancy, third trimester: Secondary | ICD-10-CM

## 2021-09-15 NOTE — Progress Notes (Signed)
ROB [redacted] wks GA Not taking Zoloft, sts symptoms manageable Asking about C/S plans: cont to request Dr. Ashok Pall for repeat C/S  Noticeable anxiety surrounding delivery, lots of questions, concerns, asking about having a hemorrhage cart in the room, worried for close spacing of previous delivery to this one. Reassurance given. Delivery planning and emergency preparedness in the hospital explained.Questions answered.

## 2021-09-15 NOTE — Progress Notes (Signed)
   PRENATAL VISIT NOTE  Subjective:  Becky Mclaughlin is a 21 y.o. G2P1001 at [redacted]w[redacted]d being seen today for ongoing prenatal care.  She is currently monitored for the following issues for this high-risk pregnancy and has Precordial pain; Rh negative status during pregnancy; Depressive disorder; Gastroesophageal reflux disease without esophagitis; Generalized anxiety disorder with panic attacks; Henoch-Schonlein purpura (HCC); History of C-section; Abnormal Pap smear of cervix; Supervision of other normal pregnancy, antepartum; and Anxiety in pregnancy in third trimester, antepartum on their problem list.  Patient reports no complaints.  Contractions: Irritability. Vag. Bleeding: None.  Movement: Present. Denies leaking of fluid.   The following portions of the patient's history were reviewed and updated as appropriate: allergies, current medications, past family history, past medical history, past social history, past surgical history and problem list.   Objective:   Vitals:   09/15/21 1426  BP: 105/64  Pulse: 92  Weight: 169 lb (76.7 kg)    Fetal Status: Fetal Heart Rate (bpm): 144 Fundal Height: 34 cm Movement: Present     General:  Alert, oriented and cooperative. Patient is in no acute distress.  Skin: Skin is warm and dry. No rash noted.   Cardiovascular: Normal heart rate noted  Respiratory: Normal respiratory effort, no problems with respiration noted  Abdomen: Soft, gravid, appropriate for gestational age.  Pain/Pressure: Absent     Pelvic: Cervical exam deferred        Extremities: Normal range of motion.  Edema: None  Mental Status: Normal mood and affect. Normal behavior. Normal judgment and thought content.   Assessment and Plan:  Pregnancy: G2P1001 at [redacted]w[redacted]d 1. Supervision of other normal pregnancy, antepartum - Doing well, no complaints, frequent fetal movement  2. [redacted] weeks gestation of pregnancy - GBS at next visit - Continue routine prenatal care  3. History of  C-section - Plan for rLTCS, has been scheduled   4. Anxiety in pregnancy in third trimester, antepartum - Provided reassurance to patient r/t anxieties surrounding c/s delivery  5. Cerebral ventriculomegaly of fetus affecting singleton pregnancy - Mild left ventriculomegaly measuring 1.1 cm dilated noted in the fetal brain - negative CMV and toxoplasmosis screens - f/u US scheduled 09/27/2021  Preterm labor symptoms and general obstetric precautions including but not limited to vaginal bleeding, contractions, leaking of fluid and fetal movement were reviewed in detail with the patient. Please refer to After Visit Summary for other counseling recommendations.   Return in about 2 weeks (around 09/29/2021) for HOB/GBS, IN-PERSON.  Future Appointments  Date Time Provider Department Center  09/27/2021  1:15 PM [facility] NURSE [facility]  09/27/2021  1:30 PM [facility] US2 [facility]  09/29/2021  2:30 PM Raelyn Mora, CNM [facility] None  10/06/2021  2:30 PM Hermina Staggers, MD [facility] None  10/13/2021  2:30 PM Adam Phenix, MD [facility] None  10/20/2021  2:30 PM Hermina Staggers, MD [facility] None  10/27/2021  2:30 PM Anyanwu, Jethro Bastos, MD [facility] None    Corlis Hove, NP

## 2021-09-27 ENCOUNTER — Ambulatory Visit: Payer: BC Managed Care – PPO | Attending: Obstetrics

## 2021-09-27 ENCOUNTER — Encounter: Payer: Self-pay | Admitting: *Deleted

## 2021-09-27 ENCOUNTER — Ambulatory Visit: Payer: BC Managed Care – PPO | Admitting: *Deleted

## 2021-09-27 DIAGNOSIS — O358XX0 Maternal care for other (suspected) fetal abnormality and damage, not applicable or unspecified: Secondary | ICD-10-CM

## 2021-09-27 DIAGNOSIS — O09893 Supervision of other high risk pregnancies, third trimester: Secondary | ICD-10-CM | POA: Diagnosis not present

## 2021-09-27 DIAGNOSIS — O3509X0 Maternal care for (suspected) other central nervous system malformation or damage in fetus, not applicable or unspecified: Secondary | ICD-10-CM | POA: Insufficient documentation

## 2021-09-27 DIAGNOSIS — O34219 Maternal care for unspecified type scar from previous cesarean delivery: Secondary | ICD-10-CM | POA: Diagnosis not present

## 2021-09-27 DIAGNOSIS — Z3A35 35 weeks gestation of pregnancy: Secondary | ICD-10-CM | POA: Diagnosis not present

## 2021-09-29 ENCOUNTER — Ambulatory Visit (INDEPENDENT_AMBULATORY_CARE_PROVIDER_SITE_OTHER): Payer: BC Managed Care – PPO | Admitting: Obstetrics and Gynecology

## 2021-09-29 ENCOUNTER — Other Ambulatory Visit (HOSPITAL_COMMUNITY)
Admission: RE | Admit: 2021-09-29 | Discharge: 2021-09-29 | Disposition: A | Discharge status: Home / Self Care | Payer: BC Managed Care – PPO | Source: Ambulatory Visit | Attending: Obstetrics and Gynecology | Admitting: Obstetrics and Gynecology

## 2021-09-29 ENCOUNTER — Encounter: Payer: Self-pay | Admitting: Obstetrics and Gynecology

## 2021-09-29 VITALS — BP 127/79 | HR 76 | Wt 175.2 lb

## 2021-09-29 DIAGNOSIS — Z3483 Encounter for supervision of other normal pregnancy, third trimester: Secondary | ICD-10-CM | POA: Insufficient documentation

## 2021-09-29 DIAGNOSIS — Z3480 Encounter for supervision of other normal pregnancy, unspecified trimester: Secondary | ICD-10-CM

## 2021-09-29 DIAGNOSIS — H539 Unspecified visual disturbance: Secondary | ICD-10-CM

## 2021-09-29 DIAGNOSIS — K5900 Constipation, unspecified: Secondary | ICD-10-CM

## 2021-09-29 DIAGNOSIS — Z3A36 36 weeks gestation of pregnancy: Secondary | ICD-10-CM

## 2021-09-29 DIAGNOSIS — O99613 Diseases of the digestive system complicating pregnancy, third trimester: Secondary | ICD-10-CM

## 2021-09-29 MED ORDER — MAGNESIUM 200 MG PO TABS: 400.0000 mg | ORAL_TABLET | Freq: Every day | ORAL | 1 refills | Status: DC

## 2021-09-29 NOTE — Progress Notes (Signed)
   LOW-RISK PREGNANCY OFFICE VISIT Patient name: Becky Mclaughlin MRN [id number]  Date of birth: 01/17/2001 Chief Complaint:   Routine Prenatal Visit  History of Present Illness:   Becky Mclaughlin is a 20 y.o. G2P1001 female at [redacted]w[redacted]d with an Estimated Date of Delivery: 10/27/21 being seen today for ongoing management of a low-risk pregnancy.  Today she reports  visual disturbance of floaters in vision daily x 1 month. She denies H/A, epigastric pain or increased edema . RCS is scheduled for 10/20/21 with Dr. Pickens. Patient wanted to know if she could have it rescheduled with Dr. Wouk as she previously requested. Contractions: Not present. Vag. Bleeding: None.  Movement: Present. denies leaking of fluid. Review of Systems:   Pertinent items are noted in HPI Denies abnormal vaginal discharge w/ itching/odor/irritation, headaches, visual changes, shortness of breath, chest pain, abdominal pain, severe nausea/vomiting, or problems with urination or bowel movements unless otherwise stated above. Pertinent History Reviewed:  Reviewed past medical,surgical, social, obstetrical and family history.  Reviewed problem list, medications and allergies. Physical Assessment:   Vitals:   09/29/21 1436  BP: 127/79  Pulse: 76  Weight: 175 lb 3.2 oz (79.5 kg)  Body mass index is 25.87 kg/m.        Physical Examination:   General appearance: Well appearing, and in no distress  Mental status: Alert, oriented to person, place, and time  Skin: Warm & dry  Cardiovascular: Normal heart rate noted  Respiratory: Normal respiratory effort, no distress  Abdomen: Soft, gravid, nontender  Pelvic: Cervical exam deferred - GC/Ct and GBS obtained - cervix not checked per patient request        Extremities: Edema: None  Fetal Status: Fetal Heart Rate (bpm): 129 Fundal Height: 36 cm Movement: Present Presentation: Undeterminable  No results found for this or any previous visit (from the past 24 hour(s)).  Assessment &  Plan:  1) Low-risk pregnancy G2P1001 at [redacted]w[redacted]d with an Estimated Date of Delivery: 10/27/21   2) Supervision of other normal pregnancy, antepartum  - Culture, beta strep (group b only),  - Cervicovaginal ancillary only( [place])  3) Visual disturbances  - CBC,  - Comp Met (CMET),  - Protein / creatinine ratio, urine  4) Constipation during pregnancy in third trimester  - Rx for Magnesium 200 MG TABS    Meds:  Meds ordered this encounter  Medications   Magnesium 200 MG TABS    Sig: Take 2 tablets (400 mg total) by mouth at bedtime.    Dispense:  30 tablet    Refill:  1    Order Specific Question:   Supervising Provider    Answer:   PRATT, TANYA S [[id number]]   Labs/procedures today: GBS & GC/CT  Plan:  Continue routine obstetrical care   Reviewed: Preterm labor symptoms and general obstetric precautions including but not limited to vaginal bleeding, contractions, leaking of fluid and fetal movement were reviewed in detail with the patient.  All questions were answered. Has home bp cuff. Check bp weekly, let us know if >140/90.   Follow-up: Return in about 1 week (around 10/06/2021) for Return OB visit.  Orders Placed This Encounter  Procedures   Culture, beta strep (group b only)   CBC   Comp Met (CMET)   Protein / creatinine ratio, urine   Rolitta Dawson MSN, CNM 09/29/2021 3:06 PM 

## 2021-09-29 NOTE — Progress Notes (Signed)
Pt presents for ROB visit. Pt c/o seeing "floaters or white spot" daily for the last month that's has been worsening. Pt denies any other symptoms when this occurs. Pt also c/o of constipation and states she believes she has hemorrhoids.

## 2021-09-30 LAB — CBC
Hematocrit: 36.1 % (ref 34.0–46.6)
Hemoglobin: 12.2 g/dL (ref 11.1–15.9)
MCH: 29.1 pg (ref 26.6–33.0)
MCHC: 33.8 g/dL (ref 31.5–35.7)
MCV: 86 fL (ref 79–97)
Platelets: 328 10*3/uL (ref 150–450)
RBC: 4.19 x10E6/uL (ref 3.77–5.28)
RDW: 12.5 % (ref 11.7–15.4)
WBC: 10.2 10*3/uL (ref 3.4–10.8)

## 2021-09-30 LAB — COMPREHENSIVE METABOLIC PANEL
ALT: 8 IU/L (ref 0–32)
AST: 13 IU/L (ref 0–40)
Albumin/Globulin Ratio: 1.4 (ref 1.2–2.2)
Albumin: 3.6 g/dL — ABNORMAL LOW (ref 4.0–5.0)
Alkaline Phosphatase: 129 IU/L — ABNORMAL HIGH (ref 44–121)
BUN/Creatinine Ratio: 15 (ref 9–23)
BUN: 8 mg/dL (ref 6–20)
Bilirubin Total: 0.4 mg/dL (ref 0.0–1.2)
CO2: 18 mmol/L — ABNORMAL LOW (ref 20–29)
Calcium: 9.4 mg/dL (ref 8.7–10.2)
Chloride: 103 mmol/L (ref 96–106)
Creatinine, Ser: 0.52 mg/dL — ABNORMAL LOW (ref 0.57–1.00)
Globulin, Total: 2.5 g/dL (ref 1.5–4.5)
Glucose: 103 mg/dL — ABNORMAL HIGH (ref 70–99)
Potassium: 4.1 mmol/L (ref 3.5–5.2)
Sodium: 137 mmol/L (ref 134–144)
Total Protein: 6.1 g/dL (ref 6.0–8.5)
eGFR: 135 mL/min/{1.73_m2} (ref >59)

## 2021-09-30 LAB — PROTEIN / CREATININE RATIO, URINE
Creatinine, Urine: 11.5 mg/dL
Protein, Ur: <4 mg/dL

## 2021-10-01 LAB — CERVICOVAGINAL ANCILLARY ONLY
Bacterial Vaginitis (gardnerella): NEGATIVE
Candida Glabrata: NEGATIVE
Candida Vaginitis: NEGATIVE
Chlamydia: NEGATIVE
Comment: NEGATIVE
Comment: NEGATIVE
Comment: NEGATIVE
Comment: NEGATIVE
Comment: NEGATIVE
Comment: NORMAL
Neisseria Gonorrhea: NEGATIVE
Trichomonas: NEGATIVE

## 2021-10-02 ENCOUNTER — Encounter: Payer: Self-pay | Admitting: Obstetrics and Gynecology

## 2021-10-03 LAB — CULTURE, BETA STREP (GROUP B ONLY): Strep Gp B Culture: NEGATIVE

## 2021-10-04 ENCOUNTER — Telehealth: Payer: Self-pay | Admitting: *Deleted

## 2021-10-04 NOTE — Telephone Encounter (Signed)
TC from pt reporting lower abdomen "cramps". Unable to state if abdomen is getting tight or hard. Advised pt to empty bladder, drink water, rest and see if pain goes away. Advised to seek care in [facility] for regular UC's that are getting more painful or for suspected SROM. Pt verbalized understanding.

## 2021-10-06 ENCOUNTER — Ambulatory Visit (INDEPENDENT_AMBULATORY_CARE_PROVIDER_SITE_OTHER): Payer: BC Managed Care – PPO | Admitting: Obstetrics and Gynecology

## 2021-10-06 ENCOUNTER — Encounter: Payer: Self-pay | Admitting: Obstetrics and Gynecology

## 2021-10-06 VITALS — BP 129/74 | HR 97 | Wt 179.2 lb

## 2021-10-06 DIAGNOSIS — Z3483 Encounter for supervision of other normal pregnancy, third trimester: Secondary | ICD-10-CM

## 2021-10-06 DIAGNOSIS — Z3480 Encounter for supervision of other normal pregnancy, unspecified trimester: Secondary | ICD-10-CM

## 2021-10-06 DIAGNOSIS — Z3A37 37 weeks gestation of pregnancy: Secondary | ICD-10-CM

## 2021-10-06 DIAGNOSIS — O26893 Other specified pregnancy related conditions, third trimester: Secondary | ICD-10-CM

## 2021-10-06 DIAGNOSIS — O3509X1 Maternal care for (suspected) other central nervous system malformation or damage in fetus, fetus 1: Secondary | ICD-10-CM | POA: Insufficient documentation

## 2021-10-06 DIAGNOSIS — Z98891 History of uterine scar from previous surgery: Secondary | ICD-10-CM

## 2021-10-06 DIAGNOSIS — O26892 Other specified pregnancy related conditions, second trimester: Secondary | ICD-10-CM

## 2021-10-06 DIAGNOSIS — F419 Anxiety disorder, unspecified: Secondary | ICD-10-CM

## 2021-10-06 DIAGNOSIS — O99343 Other mental disorders complicating pregnancy, third trimester: Secondary | ICD-10-CM

## 2021-10-06 DIAGNOSIS — Z6791 Unspecified blood type, Rh negative: Secondary | ICD-10-CM

## 2021-10-06 NOTE — Progress Notes (Signed)
Subjective:  Becky Mclaughlin is a 21 y.o. G2P1001 at [redacted]w[redacted]d being seen today for ongoing prenatal care.  She is currently monitored for the following issues for this low-risk pregnancy and has Precordial pain; Rh negative status during pregnancy; Depressive disorder; Gastroesophageal reflux disease without esophagitis; Generalized anxiety disorder with panic attacks; Henoch-Schonlein purpura (HCC); History of C-section; Abnormal Pap smear of cervix; Supervision of other normal pregnancy, antepartum; Anxiety in pregnancy in third trimester, antepartum; and Pregnancy complicated by fetal cerebral ventriculomegaly, fetus 1 on their problem list.  Patient reports general discomforts of pregnancy.  Contractions: Irritability. Vag. Bleeding: None.  Movement: Present. Denies leaking of fluid.   The following portions of the patient's history were reviewed and updated as appropriate: allergies, current medications, past family history, past medical history, past social history, past surgical history and problem list. Problem list updated.  Objective:   Vitals:   10/06/21 1432  BP: 129/74  Pulse: 97  Weight: 179 lb 3.2 oz (81.3 kg)    Fetal Status: Fetal Heart Rate (bpm): 133   Movement: Present     General:  Alert, oriented and cooperative. Patient is in no acute distress.  Skin: Skin is warm and dry. No rash noted.   Cardiovascular: Normal heart rate noted  Respiratory: Normal respiratory effort, no problems with respiration noted  Abdomen: Soft, gravid, appropriate for gestational age. Pain/Pressure: Present     Pelvic:  Cervical exam deferred        Extremities: Normal range of motion.  Edema: None  Mental Status: Normal mood and affect. Normal behavior. Normal judgment and thought content.   Urinalysis:      Assessment and Plan:  Pregnancy: G2P1001 at [redacted]w[redacted]d  1. Supervision of other normal pregnancy, antepartum Labor precautions  2. Anxiety in pregnancy in third trimester,  antepartum Stable  3. History of C-section For repeat  4. Rh negative status during pregnancy in second trimester S/P Rhogam  5. Pregnancy complicated by fetal cerebral ventriculomegaly, fetus 1 Resolved on last U/S  Term labor symptoms and general obstetric precautions including but not limited to vaginal bleeding, contractions, leaking of fluid and fetal movement were reviewed in detail with the patient. Please refer to After Visit Summary for other counseling recommendations.  Return in about 1 week (around 10/13/2021) for OB visit, face to face, any provider.   Hermina Staggers, MD

## 2021-10-06 NOTE — Patient Instructions (Signed)
Cesarean Delivery, Care After The following information offers guidance on how to care for yourself after your procedure. Your health care provider may also give you more specific instructions. If you have problems or questions, contact your health care provider. What can I expect after the procedure? After the procedure, it is common to have: A small amount of blood or clear fluid coming from the incision. Some redness, swelling, and pain in your incision area. Some abdominal pain and soreness. Vaginal bleeding (lochia). Even though you did not have a vaginal delivery, you will still have vaginal bleeding and discharge. Pelvic cramps. Fatigue. You may have pain, swelling, and discomfort in the tissue between your vagina and your anus (perineum) if: Your C-section was unplanned, and you were allowed to labor and push. An incision was made in the area (episiotomy) or the tissue tore during attempted vaginal delivery. Follow these instructions at home: Medicines Take over-the-counter and prescription medicines only as told by your health care provider. If you were prescribed an antibiotic medicine, take it as told by your health care provider. Do not stop taking the antibiotic even if you start to feel better. Ask your health care provider if the medicine prescribed to you requires you to avoid driving or using machinery. Incision care  Follow instructions from your health care provider about how to take care of your incision. Make sure you: Wash your hands with soap and water for an least 20 seconds before and after you change your bandage (dressing). If soap and water are not available, use hand sanitizer. If you have a dressing, change it or remove it as told by your health care provider. Leave stitches (sutures), skin staples, skin glue, or adhesive strips in place. These skin closures may need to stay in place for 2 weeks or longer. If adhesive strip edges start to loosen and curl up, you  may trim the loose edges. Do not remove adhesive strips completely unless your health care provider tells you to do that. Check your incision area every day for signs of infection. Check for: More redness, swelling, or pain. More fluid or blood. Warmth. Pus or a bad smell. Do not take baths, swim, or use a hot tub until your health care provider approves. Ask your health care provider if you may take showers. When you cough or sneeze, hug a pillow. This helps with pain and decreases the chance of your incision opening up (dehiscing). Do this until your incision heals. Managing constipation Your procedure may cause constipation. To prevent or treat constipation, you may need to: Drink enough fluid to keep your urine pale yellow. Take over-the-counter or prescription medicines. Eat foods that are high in fiber, such as beans, whole grains, and fresh fruits and vegetables. Limit foods that are high in fat and processed sugars, such as fried or sweet foods. Activity  If possible, have someone help you care for your baby and help with household activities for at least a few days after you leave the hospital. Rest as much as possible. Try to rest or take a nap while your baby is sleeping. You may have to avoid lifting. Ask your health care provider how much you can safely lift. Return to your normal activities as told by your health care provider. Ask your health care provider what activities are safe for you. Talk with your health care provider about when you can engage in sexual activity. This may depend on your: Risk of infection. How fast you heal. Comfort   and desire to engage in sexual activity. Lifestyle Do not drink alcohol. This is especially important if you are breastfeeding or taking pain medicine. Do not use any products that contain nicotine or tobacco. These products include cigarettes, chewing tobacco, and vaping devices, such as e-cigarettes. If you need help quitting, ask your  health care provider. General instructions Do not use tampons or douches until your health care provider approves. Wear loose, comfortable clothing and a supportive and well-fitting bra. If you pass a blood clot, save it and call your health care provider to discuss. Do not flush blood clots down the toilet before you get instructions from your health care provider. Keep all follow-up visits for you and your baby. This is important. Contact a health care provider if: You have: A fever. Dizziness or light-headedness. Bad-smelling vaginal discharge. A blood clot pass from your vagina. Pus, blood, or a bad smell coming from your incision. An incision that feels warm to the touch. More redness, swelling, or pain around your incision. Difficulty or pain when urinating. Nausea or vomiting. Little or no interest in activities you used to enjoy. Your breasts turn red or become painful or hard. You feel unusually sad or worried. You have questions about caring for yourself or your baby. You have redness, swelling, and pain in an arm or leg. Get help right away if: You have: Pain that does not go away or get better with medicine. Chest pain. Trouble breathing. Blurred vision, spots, or flashing lights in your vision. Thoughts about hurting yourself or your baby. New pain in your abdomen or in one of your legs. A severe headache that does not get better with pain medicine. You faint. You bleed from your vagina so much that you fill more than one sanitary pad in one hour. Bleeding should not be heavier than your heaviest period. These symptoms may be an emergency. Get help right away. Call 911. Do not wait to see if the symptoms will go away. Do not drive yourself to the hospital. Get help right away if you feel like you may hurt yourself or others, or have thoughts about taking your own life. Go to your nearest emergency room or: Call 911. Call the National Suicide Prevention Lifeline at  1-800-273-8255 or 988. This is open 24 hours a day. Text the Crisis Text Line at 741741. Summary After the procedure, it is common to have pain at your incision site, abdominal cramping, and slight bleeding from your vagina. Check your incision area every day for signs of infection. Tell your health care provider about any unusual symptoms. Keep all follow-up visits for you and your baby. This is important. This information is not intended to replace advice given to you by your health care provider. Make sure you discuss any questions you have with your health care provider. Document Revised: 09/09/2020 Document Reviewed: 09/09/2020 Elsevier Patient Education  2023 [employer]  

## 2021-10-06 NOTE — Patient Instructions (Addendum)
Becky Mclaughlin  10/06/2021   Your procedure is scheduled on:  10/20/2021  Arrive at 0745 at Entrance C on [address] at [facility]  and [address]. You are invited to use the FREE valet parking or use the Visitor's parking deck.  Pick up the phone at the desk and dial [phone].  Call this number if you have problems the morning of surgery: [phone]  Remember:   Do not eat food:(After Midnight) Desps de medianoche.  Do not drink clear liquids: (After Midnight) Desps de medianoche.  Take these medicines the morning of surgery with A SIP OF WATER:  Take pepcid if needed   Do not wear jewelry, make-up or nail polish.  Do not wear lotions, powders, or perfumes. Do not wear deodorant.  Do not shave 48 hours prior to surgery.  Do not bring valuables to the hospital.  [facility] is not   responsible for any belongings or valuables brought to the hospital.  Contacts, dentures or bridgework may not be worn into surgery.  Leave suitcase in the car. After surgery it may be brought to your room.  For patients admitted to the hospital, checkout time is 11:00 AM the day of              discharge.      Please read over the following fact sheets that you were given:     Preparing for Surgery

## 2021-10-06 NOTE — Progress Notes (Signed)
Pt presents for ROB visit. No concerns at this time.  

## 2021-10-08 ENCOUNTER — Telehealth (HOSPITAL_COMMUNITY): Payer: Self-pay | Admitting: *Deleted

## 2021-10-08 ENCOUNTER — Encounter (HOSPITAL_COMMUNITY): Payer: Self-pay

## 2021-10-08 NOTE — Telephone Encounter (Signed)
Preadmission screen  

## 2021-10-11 ENCOUNTER — Encounter (HOSPITAL_COMMUNITY): Payer: Self-pay

## 2021-10-13 ENCOUNTER — Ambulatory Visit (INDEPENDENT_AMBULATORY_CARE_PROVIDER_SITE_OTHER): Payer: BC Managed Care – PPO | Admitting: Obstetrics & Gynecology

## 2021-10-13 ENCOUNTER — Other Ambulatory Visit: Payer: Self-pay | Admitting: Obstetrics and Gynecology

## 2021-10-13 VITALS — BP 130/85 | HR 82 | Wt 180.0 lb

## 2021-10-13 DIAGNOSIS — F410 Panic disorder [episodic paroxysmal anxiety] without agoraphobia: Secondary | ICD-10-CM

## 2021-10-13 DIAGNOSIS — O99343 Other mental disorders complicating pregnancy, third trimester: Secondary | ICD-10-CM

## 2021-10-13 DIAGNOSIS — Z3A36 36 weeks gestation of pregnancy: Secondary | ICD-10-CM

## 2021-10-13 DIAGNOSIS — O34219 Maternal care for unspecified type scar from previous cesarean delivery: Secondary | ICD-10-CM

## 2021-10-13 DIAGNOSIS — Z3480 Encounter for supervision of other normal pregnancy, unspecified trimester: Secondary | ICD-10-CM

## 2021-10-13 DIAGNOSIS — Z3483 Encounter for supervision of other normal pregnancy, third trimester: Secondary | ICD-10-CM

## 2021-10-13 DIAGNOSIS — F411 Generalized anxiety disorder: Secondary | ICD-10-CM

## 2021-10-13 DIAGNOSIS — F419 Anxiety disorder, unspecified: Secondary | ICD-10-CM

## 2021-10-13 DIAGNOSIS — Z01818 Encounter for other preprocedural examination: Secondary | ICD-10-CM

## 2021-10-13 MED ORDER — CEFAZOLIN SODIUM-DEXTROSE 2-4 GM/100ML-% IV SOLN: 2.0000 g | INTRAVENOUS | Status: DC

## 2021-10-13 MED ORDER — SOD CITRATE-CITRIC ACID 500-334 MG/5ML PO SOLN: 30.0000 mL | ORAL | Status: DC

## 2021-10-13 NOTE — Progress Notes (Signed)
   PRENATAL VISIT NOTE  Subjective:  Becky Mclaughlin is a 21 y.o. G2P1001 at [redacted]w[redacted]d being seen today for ongoing prenatal care.  She is currently monitored for the following issues for this low-risk pregnancy and has Precordial pain; Rh negative status during pregnancy; Depressive disorder; Gastroesophageal reflux disease without esophagitis; Generalized anxiety disorder with panic attacks; Henoch-Schonlein purpura (HCC); History of C-section; Abnormal Pap smear of cervix; Supervision of other normal pregnancy, antepartum; Anxiety in pregnancy in third trimester, antepartum; Pregnancy complicated by fetal cerebral ventriculomegaly, fetus 1; and History of cesarean delivery, currently pregnant on their problem list.  Patient reports occasional contractions.  Contractions: Irregular. Vag. Bleeding: None.  Movement: Present. Denies leaking of fluid.   The following portions of the patient's history were reviewed and updated as appropriate: allergies, current medications, past family history, past medical history, past social history, past surgical history and problem list.   Objective:   Vitals:   10/13/21 1434  BP: 130/85  Pulse: 82  Weight: 180 lb (81.6 kg)    Fetal Status: Fetal Heart Rate (bpm): 140   Movement: Present  Presentation: Complete Breech  General:  Alert, oriented and cooperative. Patient is in no acute distress.  Skin: Skin is warm and dry. No rash noted.   Cardiovascular: Normal heart rate noted  Respiratory: Normal respiratory effort, no problems with respiration noted  Abdomen: Soft, gravid, appropriate for gestational age.  Pain/Pressure: Present     Pelvic: Cervical exam performed in the presence of a chaperone Dilation: 1.5 Effacement (%): 30 Station: Ballotable  Extremities: Normal range of motion.     Mental Status: Normal mood and affect. Normal behavior. Normal judgment and thought content.   Assessment and Plan:  Pregnancy: G2P1001 at [redacted]w[redacted]d 1. History of cesarean  delivery, currently pregnant RCS 39 weeks  2. Anxiety in pregnancy in third trimester, antepartum   3. Supervision of other normal pregnancy, antepartum Breech presentation  4. Generalized anxiety disorder with panic attacks   Term labor symptoms and general obstetric precautions including but not limited to vaginal bleeding, contractions, leaking of fluid and fetal movement were reviewed in detail with the patient. Please refer to After Visit Summary for other counseling recommendations.   Return if symptoms worsen or fail to improve, for postpartum.  Future Appointments  Date Time Provider Department Center  10/18/2021 10:30 AM [facility] PAT 1 [facility] None  10/20/2021  2:30 PM Hermina Staggers, MD [facility] None  10/20/2021  3:00 PM Gwyndolyn Saxon, LCSW [facility] None  10/27/2021  2:30 PM Anyanwu, Jethro Bastos, MD [facility] None    Scheryl Darter, MD

## 2021-10-18 ENCOUNTER — Other Ambulatory Visit: Payer: Self-pay

## 2021-10-18 ENCOUNTER — Inpatient Hospital Stay (HOSPITAL_COMMUNITY): Payer: Medicaid Other | Admitting: Certified Registered Nurse Anesthetist

## 2021-10-18 ENCOUNTER — Telehealth: Payer: Self-pay

## 2021-10-18 ENCOUNTER — Inpatient Hospital Stay (HOSPITAL_COMMUNITY)
Admission: AD | Admit: 2021-10-18 | Discharge: 2021-10-21 | DRG: 785 | Disposition: A | Discharge status: Home / Self Care | Payer: Medicaid Other | Attending: Family Medicine | Admitting: Family Medicine

## 2021-10-18 ENCOUNTER — Encounter (HOSPITAL_COMMUNITY)
Admission: AD | Disposition: A | Discharge status: Home / Self Care | Payer: Self-pay | Source: Home / Self Care | Attending: Obstetrics and Gynecology

## 2021-10-18 ENCOUNTER — Encounter (HOSPITAL_COMMUNITY)
Admission: RE | Admit: 2021-10-18 | Discharge: 2021-10-18 | Disposition: A | Discharge status: Home / Self Care | Payer: Medicaid Other | Source: Ambulatory Visit | Attending: Obstetrics and Gynecology | Admitting: Obstetrics and Gynecology

## 2021-10-18 ENCOUNTER — Encounter (HOSPITAL_COMMUNITY): Payer: Self-pay | Admitting: Obstetrics and Gynecology

## 2021-10-18 VITALS — Ht 69.0 in | Wt 180.0 lb

## 2021-10-18 DIAGNOSIS — Z3A38 38 weeks gestation of pregnancy: Secondary | ICD-10-CM | POA: Diagnosis not present

## 2021-10-18 DIAGNOSIS — O26893 Other specified pregnancy related conditions, third trimester: Secondary | ICD-10-CM | POA: Diagnosis present

## 2021-10-18 DIAGNOSIS — Z3480 Encounter for supervision of other normal pregnancy, unspecified trimester: Secondary | ICD-10-CM

## 2021-10-18 DIAGNOSIS — Z302 Encounter for sterilization: Secondary | ICD-10-CM

## 2021-10-18 DIAGNOSIS — Z98891 History of uterine scar from previous surgery: Secondary | ICD-10-CM | POA: Insufficient documentation

## 2021-10-18 DIAGNOSIS — O3509X1 Maternal care for (suspected) other central nervous system malformation or damage in fetus, fetus 1: Secondary | ICD-10-CM | POA: Diagnosis present

## 2021-10-18 DIAGNOSIS — O26899 Other specified pregnancy related conditions, unspecified trimester: Secondary | ICD-10-CM

## 2021-10-18 DIAGNOSIS — O34211 Maternal care for low transverse scar from previous cesarean delivery: Secondary | ICD-10-CM | POA: Diagnosis present

## 2021-10-18 DIAGNOSIS — O34219 Maternal care for unspecified type scar from previous cesarean delivery: Secondary | ICD-10-CM

## 2021-10-18 DIAGNOSIS — O3509X0 Maternal care for (suspected) other central nervous system malformation or damage in fetus, not applicable or unspecified: Secondary | ICD-10-CM | POA: Diagnosis present

## 2021-10-18 DIAGNOSIS — Z6791 Unspecified blood type, Rh negative: Secondary | ICD-10-CM | POA: Diagnosis not present

## 2021-10-18 DIAGNOSIS — O321XX0 Maternal care for breech presentation, not applicable or unspecified: Secondary | ICD-10-CM | POA: Diagnosis present

## 2021-10-18 DIAGNOSIS — F410 Panic disorder [episodic paroxysmal anxiety] without agoraphobia: Secondary | ICD-10-CM | POA: Diagnosis present

## 2021-10-18 DIAGNOSIS — F32A Depression, unspecified: Secondary | ICD-10-CM | POA: Diagnosis present

## 2021-10-18 LAB — CBC
HCT: 38.3 % (ref 36.0–46.0)
Hemoglobin: 12.6 g/dL (ref 12.0–15.0)
MCH: 28.4 pg (ref 26.0–34.0)
MCHC: 32.9 g/dL (ref 30.0–36.0)
MCV: 86.5 fL (ref 80.0–100.0)
Platelets: 336 10*3/uL (ref 150–400)
RBC: 4.43 MIL/uL (ref 3.87–5.11)
RDW: 13.5 % (ref 11.5–15.5)
WBC: 11 10*3/uL — ABNORMAL HIGH (ref 4.0–10.5)
nRBC: 0 % (ref 0.0–0.2)

## 2021-10-18 LAB — TYPE AND SCREEN
ABO/RH(D): O NEG
Antibody Screen: POSITIVE

## 2021-10-18 SURGERY — Surgical Case
Anesthesia: Spinal

## 2021-10-18 MED ORDER — METOCLOPRAMIDE HCL 5 MG/ML IJ SOLN
INTRAMUSCULAR | Status: AC
  Filled 2021-10-18: qty 2

## 2021-10-18 MED ORDER — DEXAMETHASONE SODIUM PHOSPHATE 10 MG/ML IJ SOLN
INTRAMUSCULAR | Status: DC | PRN
  Administered 2021-10-18: 10 mg via INTRAVENOUS

## 2021-10-18 MED ORDER — ONDANSETRON HCL 4 MG/2ML IJ SOLN
INTRAMUSCULAR | Status: AC
  Filled 2021-10-18: qty 2

## 2021-10-18 MED ORDER — OXYTOCIN-SODIUM CHLORIDE 30-0.9 UT/500ML-% IV SOLN
INTRAVENOUS | Status: DC | PRN
  Administered 2021-10-18: 300 mL via INTRAVENOUS

## 2021-10-18 MED ORDER — KETOROLAC TROMETHAMINE 30 MG/ML IJ SOLN
30.0000 mg | Freq: Four times a day (QID) | INTRAMUSCULAR | Status: AC | PRN
  Administered 2021-10-19: 30 mg via INTRAVENOUS
  Filled 2021-10-18: qty 1

## 2021-10-18 MED ORDER — STERILE WATER FOR IRRIGATION IR SOLN
Status: DC | PRN
  Administered 2021-10-18: 1

## 2021-10-18 MED ORDER — MORPHINE SULFATE (PF) 0.5 MG/ML IJ SOLN
INTRAMUSCULAR | Status: DC | PRN
  Administered 2021-10-18: .15 mg via INTRATHECAL

## 2021-10-18 MED ORDER — PHENYLEPHRINE HCL-NACL 20-0.9 MG/250ML-% IV SOLN
INTRAVENOUS | Status: AC
  Filled 2021-10-18: qty 250

## 2021-10-18 MED ORDER — METOCLOPRAMIDE HCL 5 MG/ML IJ SOLN
INTRAMUSCULAR | Status: DC | PRN
  Administered 2021-10-18: 10 mg via INTRAVENOUS

## 2021-10-18 MED ORDER — LACTATED RINGERS IV SOLN
INTRAVENOUS | Status: AC
Start: 2021-10-19 — End: 2021-10-19

## 2021-10-18 MED ORDER — SOD CITRATE-CITRIC ACID 500-334 MG/5ML PO SOLN: 30.0000 mL | ORAL | Status: DC

## 2021-10-18 MED ORDER — DIPHENHYDRAMINE HCL 50 MG/ML IJ SOLN: 12.5000 mg | INTRAMUSCULAR | Status: DC | PRN

## 2021-10-18 MED ORDER — ACETAMINOPHEN 10 MG/ML IV SOLN
INTRAVENOUS | Status: DC | PRN
  Administered 2021-10-18: 1000 mg via INTRAVENOUS

## 2021-10-18 MED ORDER — KETOROLAC TROMETHAMINE 30 MG/ML IJ SOLN
INTRAMUSCULAR | Status: AC
  Filled 2021-10-18: qty 1

## 2021-10-18 MED ORDER — SOD CITRATE-CITRIC ACID 500-334 MG/5ML PO SOLN
ORAL | Status: AC
  Administered 2021-10-18: 30 mL
  Filled 2021-10-18: qty 30

## 2021-10-18 MED ORDER — CEFAZOLIN SODIUM-DEXTROSE 2-4 GM/100ML-% IV SOLN
INTRAVENOUS | Status: AC
  Filled 2021-10-18: qty 100

## 2021-10-18 MED ORDER — SCOPOLAMINE 1 MG/3DAYS TD PT72
1.0000 | MEDICATED_PATCH | Freq: Once | TRANSDERMAL | Status: DC
  Administered 2021-10-18: 1.5 mg via TRANSDERMAL

## 2021-10-18 MED ORDER — MORPHINE SULFATE (PF) 0.5 MG/ML IJ SOLN
INTRAMUSCULAR | Status: AC
  Filled 2021-10-18: qty 10

## 2021-10-18 MED ORDER — OXYTOCIN-SODIUM CHLORIDE 30-0.9 UT/500ML-% IV SOLN
INTRAVENOUS | Status: AC
  Filled 2021-10-18: qty 500

## 2021-10-18 MED ORDER — OXYCODONE HCL 5 MG PO TABS
5.0000 mg | ORAL_TABLET | Freq: Once | ORAL | Status: DC
  Filled 2021-10-18: qty 1

## 2021-10-18 MED ORDER — DIPHENHYDRAMINE HCL 25 MG PO CAPS: 25.0000 mg | ORAL_CAPSULE | ORAL | Status: DC | PRN

## 2021-10-18 MED ORDER — LACTATED RINGERS IV SOLN: INTRAVENOUS | Status: DC

## 2021-10-18 MED ORDER — POVIDONE-IODINE 10 % EX SWAB
2.0000 | Freq: Once | CUTANEOUS | Status: AC
  Administered 2021-10-18: 2 via TOPICAL

## 2021-10-18 MED ORDER — DEXAMETHASONE SODIUM PHOSPHATE 10 MG/ML IJ SOLN
INTRAMUSCULAR | Status: AC
  Filled 2021-10-18: qty 1

## 2021-10-18 MED ORDER — ONDANSETRON HCL 4 MG/2ML IJ SOLN
INTRAMUSCULAR | Status: DC | PRN
  Administered 2021-10-18: 4 mg via INTRAVENOUS

## 2021-10-18 MED ORDER — FENTANYL CITRATE (PF) 100 MCG/2ML IJ SOLN: 25.0000 ug | INTRAMUSCULAR | Status: DC | PRN

## 2021-10-18 MED ORDER — NALOXONE HCL 4 MG/10ML IJ SOLN: 1.0000 ug/kg/h | INTRAVENOUS | Status: DC | PRN

## 2021-10-18 MED ORDER — ONDANSETRON HCL 4 MG/2ML IJ SOLN: 4.0000 mg | Freq: Three times a day (TID) | INTRAMUSCULAR | Status: DC | PRN

## 2021-10-18 MED ORDER — BUPIVACAINE IN DEXTROSE 0.75-8.25 % IT SOLN
INTRATHECAL | Status: DC | PRN
  Administered 2021-10-18: 1.8 mL via INTRATHECAL

## 2021-10-18 MED ORDER — KETOROLAC TROMETHAMINE 30 MG/ML IJ SOLN
INTRAMUSCULAR | Status: DC | PRN
  Administered 2021-10-18: 30 mg via INTRAVENOUS

## 2021-10-18 MED ORDER — ACETAMINOPHEN 10 MG/ML IV SOLN
INTRAVENOUS | Status: AC
  Filled 2021-10-18: qty 100

## 2021-10-18 MED ORDER — KETOROLAC TROMETHAMINE 30 MG/ML IJ SOLN: 30.0000 mg | Freq: Four times a day (QID) | INTRAMUSCULAR | Status: AC | PRN

## 2021-10-18 MED ORDER — ACETAMINOPHEN 500 MG PO TABS
1000.0000 mg | ORAL_TABLET | Freq: Four times a day (QID) | ORAL | Status: AC
  Administered 2021-10-19: 1000 mg via ORAL
  Filled 2021-10-18 (×3): qty 2

## 2021-10-18 MED ORDER — NALOXONE HCL 0.4 MG/ML IJ SOLN: 0.4000 mg | INTRAMUSCULAR | Status: DC | PRN

## 2021-10-18 MED ORDER — PHENYLEPHRINE HCL-NACL 20-0.9 MG/250ML-% IV SOLN
INTRAVENOUS | Status: DC | PRN
  Administered 2021-10-18: 80 ug/min via INTRAVENOUS

## 2021-10-18 MED ORDER — FENTANYL CITRATE (PF) 100 MCG/2ML IJ SOLN
INTRAMUSCULAR | Status: AC
  Filled 2021-10-18: qty 2

## 2021-10-18 MED ORDER — FENTANYL CITRATE (PF) 100 MCG/2ML IJ SOLN
INTRAMUSCULAR | Status: DC | PRN
Start: 2021-10-18 — End: 2021-10-18
  Administered 2021-10-18: 15 ug via INTRATHECAL

## 2021-10-18 MED ORDER — PHENYLEPHRINE HCL (PRESSORS) 10 MG/ML IV SOLN
INTRAVENOUS | Status: DC | PRN
  Administered 2021-10-18 (×2): 80 ug via INTRAVENOUS

## 2021-10-18 MED ORDER — SCOPOLAMINE 1 MG/3DAYS TD PT72
MEDICATED_PATCH | TRANSDERMAL | Status: AC
  Filled 2021-10-18: qty 1

## 2021-10-18 MED ORDER — FAMOTIDINE IN NACL 20-0.9 MG/50ML-% IV SOLN
INTRAVENOUS | Status: AC
  Administered 2021-10-18: 20 mg
  Filled 2021-10-18: qty 50

## 2021-10-18 MED ORDER — ACETAMINOPHEN 10 MG/ML IV SOLN
1000.0000 mg | Freq: Once | INTRAVENOUS | Status: DC | PRN
Start: 2021-10-18 — End: 2021-10-18

## 2021-10-18 MED ORDER — SODIUM CHLORIDE 0.9% FLUSH: 3.0000 mL | INTRAVENOUS | Status: DC | PRN

## 2021-10-18 MED ORDER — CEFAZOLIN SODIUM-DEXTROSE 2-4 GM/100ML-% IV SOLN
2.0000 g | INTRAVENOUS | Status: AC
  Administered 2021-10-18: 2 g via INTRAVENOUS

## 2021-10-18 SURGICAL SUPPLY — 37 items
BENZOIN TINCTURE PRP APPL 2/3 (GAUZE/BANDAGES/DRESSINGS) ×1 IMPLANT
CHLORAPREP W/TINT 26 (MISCELLANEOUS) ×2 IMPLANT
CLAMP CORD UMBIL (MISCELLANEOUS) ×1 IMPLANT
CLOSURE STERI STRIP 1/2 X4 (GAUZE/BANDAGES/DRESSINGS) IMPLANT
CLOTH BEACON ORANGE TIMEOUT ST (SAFETY) ×1 IMPLANT
DRAPE C SECTION CLR SCREEN (DRAPES) IMPLANT
DRSG OPSITE POSTOP 4X10 (GAUZE/BANDAGES/DRESSINGS) ×1 IMPLANT
ELECT REM PT RETURN 9FT ADLT (ELECTROSURGICAL) ×1
ELECTRODE REM PT RTRN 9FT ADLT (ELECTROSURGICAL) ×1 IMPLANT
EXTRACTOR VACUUM M CUP 4 TUBE (SUCTIONS) IMPLANT
GLOVE BIO SURGEON STRL SZ7.5 (GLOVE) ×1 IMPLANT
GLOVE BIOGEL PI IND STRL 7.0 (GLOVE) ×2 IMPLANT
GLOVE BIOGEL PI INDICATOR 7.0 (GLOVE) ×2
GOWN STRL REUS W/TWL 2XL LVL3 (GOWN DISPOSABLE) ×1 IMPLANT
GOWN STRL REUS W/TWL LRG LVL3 (GOWN DISPOSABLE) ×2 IMPLANT
KIT ABG SYR 3ML LUER SLIP (SYRINGE) IMPLANT
NDL HYPO 25X5/8 SAFETYGLIDE (NEEDLE) IMPLANT
NEEDLE HYPO 22GX1.5 SAFETY (NEEDLE) ×1 IMPLANT
NEEDLE HYPO 25X5/8 SAFETYGLIDE (NEEDLE) IMPLANT
NS IRRIG 1000ML POUR BTL (IV SOLUTION) ×1 IMPLANT
PACK C SECTION WH (CUSTOM PROCEDURE TRAY) ×1 IMPLANT
PAD OB MATERNITY 4.3X12.25 (PERSONAL CARE ITEMS) ×1 IMPLANT
RTRCTR C-SECT PINK 25CM LRG (MISCELLANEOUS) ×1 IMPLANT
STRIP CLOSURE SKIN 1/2X4 (GAUZE/BANDAGES/DRESSINGS) ×1 IMPLANT
SUT CHROMIC 1 CTX 36 (SUTURE) ×2 IMPLANT
SUT PLAIN 0 NONE (SUTURE) ×1 IMPLANT
SUT VIC AB 1 CT1 36 (SUTURE) ×1 IMPLANT
SUT VIC AB 2-0 CT1 (SUTURE) ×1 IMPLANT
SUT VIC AB 3-0 CT1 27 (SUTURE) ×1
SUT VIC AB 3-0 CT1 TAPERPNT 27 (SUTURE) ×1 IMPLANT
SUT VIC AB 3-0 SH 27 (SUTURE)
SUT VIC AB 3-0 SH 27X BRD (SUTURE) IMPLANT
SUT VIC AB 4-0 KS 27 (SUTURE) ×1 IMPLANT
SYR BULB IRRIGATION 50ML (SYRINGE) IMPLANT
TOWEL OR 17X24 6PK STRL BLUE (TOWEL DISPOSABLE) ×1 IMPLANT
TRAY FOLEY W/BAG SLVR 14FR LF (SET/KITS/TRAYS/PACK) ×1 IMPLANT
WATER STERILE IRR 1000ML POUR (IV SOLUTION) ×1 IMPLANT

## 2021-10-18 NOTE — Anesthesia Postprocedure Evaluation (Signed)
Anesthesia Post Note  Patient: Becky Mclaughlin  Procedure(s) Performed: CESAREAN SECTION WITH BILATERAL TUBAL LIGATION     Patient location during evaluation: PACU Anesthesia Type: Spinal Level of consciousness: oriented and awake and alert Pain management: pain level controlled Vital Signs Assessment: post-procedure vital signs reviewed and stable Respiratory status: spontaneous breathing, respiratory function stable and patient connected to nasal cannula oxygen Cardiovascular status: blood pressure returned to baseline and stable Postop Assessment: no headache, no backache and no apparent nausea or vomiting Anesthetic complications: no   No notable events documented.  Last Vitals:  Vitals:   10/18/21 2230 10/18/21 2250  BP: 117/65 117/75  Pulse: 87 84  Resp: 19 18  Temp:    SpO2: 97% 100%    Last Pain:  Vitals:   10/18/21 2215  TempSrc: Oral  PainSc:    Pain Goal:    LLE Motor Response: Purposeful movement (10/18/21 2230) LLE Sensation: Tingling (10/18/21 2230) RLE Motor Response: Purposeful movement (10/18/21 2230) RLE Sensation: Tingling (10/18/21 2230) L Sensory Level: L1-Inguinal (groin) region (10/18/21 2230) R Sensory Level: L1-Inguinal (groin) region (10/18/21 2230) Epidural/Spinal Function Cutaneous sensation: Able to Wiggle Toes (10/18/21 2230), Patient able to flex knees: Yes (10/18/21 2230), Patient able to lift hips off bed: No (10/18/21 2230), Back pain beyond tenderness at insertion site: No (10/18/21 2230), Progressively worsening motor and/or sensory loss: No (10/18/21 2230), Bowel and/or bladder incontinence post epidural: No (10/18/21 2230)  Laruen Risser L Cletis Muma

## 2021-10-18 NOTE — Telephone Encounter (Signed)
Returned call and advised patient of labor precautions and when to proceed to the hospital, pt agreed.

## 2021-10-18 NOTE — Discharge Summary (Signed)
Postpartum Discharge Summary     Patient Name: Becky Mclaughlin DOB: 2000/04/20 MRN: [id number]  Date of admission: 10/18/2021 Delivery date:10/18/2021  Delivering provider: Chancy Milroy  Date of discharge: 10/21/2021  Admitting diagnosis: S/P cesarean section [Z98.891] History of cesarean delivery [Z98.891] Intrauterine pregnancy: [redacted]w[redacted]d    Secondary diagnosis:  Principal Problem:   S/P cesarean section Active Problems:   Rh negative status during pregnancy   Depressive disorder   Generalized anxiety disorder with panic attacks   History of C-section   Supervision of other normal pregnancy, antepartum   Pregnancy complicated by fetal cerebral ventriculomegaly, fetus 1   History of cesarean delivery  Additional problems: n/a    Discharge diagnosis: Term Pregnancy Delivered and  s/p cesection                                                Post partum procedures:postpartum tubal ligation Augmentation: N/A Complications: None  Hospital course: Onset of Labor With Unplanned C/S   21y.o. yo [id number] admitted in Latent Labor on 10/18/2021. Patient had a labor course significant for breech presentation. The patient went for cesarean section due to Malpresentation. Delivery details as follows: Membrane Rupture Time/Date: 8:29 PM ,10/18/2021   Delivery Method:C-Section, Low Transverse  Details of operation can be found in separate operative note. Patient had an uncomplicated postpartum course.  She is ambulating,tolerating a regular diet, passing flatus, and urinating well.  Patient is discharged home in stable condition 10/21/21.  Newborn Data: Birth date:10/18/2021  Birth time:8:31 PM  Gender:Female  Living status:Living  Apgars:8 ,9  Weight:3820 g   Magnesium Sulfate received: No BMZ received: No Rhophylac:Yes MMR:No T-DaP:Given prenatally Flu: N/A Transfusion:No  Physical exam  Vitals:   10/19/21 2100 10/20/21 1100 10/20/21 2100 10/21/21 0516  BP: 116/73  112/69 (!) 110/59 114/78  Pulse: 77 70 75 77  Resp: '17 16 16 17  ' Temp: 98.3 F (36.8 C) (!) 97.5 F (36.4 C) 98 F (36.7 C) 98.7 F (37.1 C)  TempSrc: Oral Oral Oral Oral  SpO2: 100% 98% 99%   Weight:      Height:       General: alert, cooperative, and no distress Lochia: appropriate Uterine Fundus: firm Incision: Healing well with no significant drainage, No significant erythema, Dressing is clean, dry, and intact DVT Evaluation: No evidence of DVT seen on physical exam. Labs: Lab Results  Component Value Date   WBC 15.9 (H) 10/19/2021   HGB 11.6 (L) 10/19/2021   HCT 35.8 (L) 10/19/2021   MCV 87.1 10/19/2021   PLT 318 10/19/2021      Latest Ref Rng & Units 09/29/2021    3:09 PM  CMP  Glucose 70 - 99 mg/dL 103   BUN 6 - 20 mg/dL 8   Creatinine 0.57 - 1.00 mg/dL 0.52   Sodium 134 - 144 mmol/L 137   Potassium 3.5 - 5.2 mmol/L 4.1   Chloride 96 - 106 mmol/L 103   CO2 20 - 29 mmol/L 18   Calcium 8.7 - 10.2 mg/dL 9.4   Total Protein 6.0 - 8.5 g/dL 6.1   Total Bilirubin 0.0 - 1.2 mg/dL 0.4   Alkaline Phos 44 - 121 IU/L 129   AST 0 - 40 IU/L 13   ALT 0 - 32 IU/L 8    Edinburgh Score:  10/19/2021   12:07 PM  Edinburgh Postnatal Depression Scale Screening Tool  I have been able to laugh and see the funny side of things. 0  I have looked forward with enjoyment to things. 0  I have blamed myself unnecessarily when things went wrong. 0  I have been anxious or worried for no good reason. 2  I have felt scared or panicky for no good reason. 2  Things have been getting on top of me. 0  I have been so unhappy that I have had difficulty sleeping. 0  I have felt sad or miserable. 1  I have been so unhappy that I have been crying. 0  The thought of harming myself has occurred to me. 0  Edinburgh Postnatal Depression Scale Total 5     After visit meds:  Allergies as of 10/21/2021       Reactions   Fluconazole Other (See Comments)   Syncopal episode and , hot flashes and  feeling lightheaded, jittery        Medication List     TAKE these medications    acetaminophen 325 MG tablet Commonly known as: TYLENOL Take 2 tablets (650 mg total) by mouth every 6 (six) hours.   cyanocobalamin 1000 MCG tablet Commonly known as: VITAMIN B12 Take 1,000 mcg by mouth daily.   famotidine 20 MG tablet Commonly known as: Pepcid Take 1 tablet (20 mg total) by mouth 2 (two) times daily as needed for heartburn or indigestion.   ibuprofen 600 MG tablet Commonly known as: ADVIL Take 1 tablet (600 mg total) by mouth every 6 (six) hours.   magnesium oxide 400 (240 Mg) MG tablet Commonly known as: MAG-OX Take 400 mg by mouth daily.   multivitamin-prenatal 27-0.8 MG Tabs tablet Take 1 tablet by mouth in the morning.   oxyCODONE 5 MG immediate release tablet Commonly known as: Oxy IR/ROXICODONE Take 1 tablet (5 mg total) by mouth every 4 (four) hours as needed for moderate pain.   TUMS PO Take 2 tablets by mouth daily as needed (heartburn).         Discharge home in stable condition Infant Feeding: Breast Infant Disposition:home with mother Discharge instruction: per After Visit Summary and Postpartum booklet. Activity: Advance as tolerated. Pelvic rest for 6 weeks.  Diet: routine diet Future Appointments: Future Appointments  Date Time Provider [place]  10/26/2021  1:20 PM [place] None  12/01/2021  3:50 PM [place]-Kirby, Kathie Dike, CNM [facility] None   Follow up Visit:  Message sent to [facility] by Autry-Lott on 10/21/2021  Please schedule this patient for a In person postpartum visit in 6 weeks with the following provider: MD and APP. Additional Postpartum F/U:Incision check 1 week  Low risk pregnancy complicated by:  fetal cerebral ventriculomegaly Delivery mode:  C-Section, Low Transverse  Anticipated Birth Control:  BTL done [facility]   10/21/2021 Oiva Dibari Autry-Lott, DO

## 2021-10-18 NOTE — MAU Provider Note (Signed)
None      S: Ms. JAZARIAH TEALL is a 21 y.o. G2P1001 at [redacted]w[redacted]d  who presents to MAU today complaining contractions q 4-5 minutes since 3p. She denies vaginal bleeding. She denies LOF. She reports normal fetal movement.    O: BP 132/80 (BP Location: Right Arm)   Pulse 94   Temp 98 F (36.7 C) (Oral)   Resp 17   Ht 5\' 9"  (1.753 m)   Wt 80.5 kg   LMP 01/20/2021 (Exact Date)   SpO2 98%   BMI 26.21 kg/m  GENERAL: Well-developed, well-nourished female in no acute distress.  HEAD: Normocephalic, atraumatic.  CHEST: Normal effort of breathing, regular heart rate ABDOMEN: Soft, nontender, gravid  Cervical exam:  Dilation: 4 Effacement (%): 70 Exam by:: F. Morris, RNC   Fetal Monitoring: Baseline: 120 Variability: moderate Accelerations: present Decelerations: none Contractions: q5 minutes   A: SIUP at [redacted]w[redacted]d  Active labor  P: Admit Will prepare for cesarean section Labs already drawn. NPO.   [redacted]w[redacted]d, DO 10/18/2021 7:08 PM

## 2021-10-18 NOTE — Transfer of Care (Signed)
Immediate Anesthesia Transfer of Care Note  Patient: Becky Mclaughlin  Procedure(s) Performed: CESAREAN SECTION WITH BILATERAL TUBAL LIGATION  Patient Location: PACU  Anesthesia Type:Spinal  Level of Consciousness: awake, alert  and oriented  Airway & Oxygen Therapy: Patient Spontanous Breathing  Post-op Assessment: Report given to RN and Post -op Vital signs reviewed and stable  Post vital signs: Reviewed and stable  Last Vitals:  Vitals Value Taken Time  BP    Temp    Pulse    Resp    SpO2      Last Pain:  Vitals:   10/18/21 1930  TempSrc:   PainSc: 8          Complications: No notable events documented.

## 2021-10-18 NOTE — Anesthesia Preprocedure Evaluation (Signed)
Anesthesia Evaluation  Patient identified by MRN, date of birth, ID band Patient awake    Reviewed: Allergy & Precautions, NPO status , Patient's Chart, lab work & pertinent test results  Airway Mallampati: II  TM Distance: >3 FB Neck ROM: Full    Dental no notable dental hx.    Pulmonary neg pulmonary ROS,    Pulmonary exam normal breath sounds clear to auscultation       Cardiovascular negative cardio ROS Normal cardiovascular exam Rhythm:Regular Rate:Normal     Neuro/Psych PSYCHIATRIC DISORDERS Anxiety Depression negative neurological ROS     GI/Hepatic Neg liver ROS, GERD  ,  Endo/Other  negative endocrine ROS  Renal/GU negative Renal ROS  negative genitourinary   Musculoskeletal negative musculoskeletal ROS (+)   Abdominal   Peds  Hematology negative hematology ROS (+)   Anesthesia Other Findings Repeat C/Sx1, breech presentation. Pt contracting and making cervical change.   Reproductive/Obstetrics (+) Pregnancy                             Anesthesia Physical Anesthesia Plan  ASA: 2 and emergent  Anesthesia Plan: Spinal   Post-op Pain Management:    Induction:   PONV Risk Score and Plan: Treatment may vary due to age or medical condition  Airway Management Planned: Natural Airway  Additional Equipment:   Intra-op Plan:   Post-operative Plan:   Informed Consent: I have reviewed the patients History and Physical, chart, labs and discussed the procedure including the risks, benefits and alternatives for the proposed anesthesia with the patient or authorized representative who has indicated his/her understanding and acceptance.     Dental advisory given  Plan Discussed with: CRNA  Anesthesia Plan Comments:         Anesthesia Quick Evaluation

## 2021-10-18 NOTE — Anesthesia Procedure Notes (Signed)
Spinal  Patient location during procedure: OR Start time: 10/18/2021 8:02 PM End time: 10/18/2021 8:05 PM Reason for block: surgical anesthesia Staffing Performed: anesthesiologist  Anesthesiologist: Elmer Picker, MD Performed by: Elmer Picker, MD Authorized by: Elmer Picker, MD   Preanesthetic Checklist Completed: patient identified, IV checked, risks and benefits discussed, surgical consent, monitors and equipment checked, pre-op evaluation and timeout performed Spinal Block Patient position: sitting Prep: DuraPrep and site prepped and draped Patient monitoring: cardiac monitor, continuous pulse ox and blood pressure Approach: midline Location: L3-4 Injection technique: single-shot Needle Needle type: Pencan  Needle gauge: 24 G Needle length: 9 cm Assessment Sensory level: T6 Events: CSF return Additional Notes Functioning IV was confirmed and monitors were applied. Sterile prep and drape, including hand hygiene and sterile gloves were used. The patient was positioned and the spine was prepped. The skin was anesthetized with lidocaine.  Free flow of clear CSF was obtained prior to injecting local anesthetic into the CSF.  The spinal needle aspirated freely following injection.  The needle was carefully withdrawn.  The patient tolerated the procedure well.

## 2021-10-18 NOTE — Op Note (Signed)
[name] PROCEDURE DATE: 10/18/2021  PREOPERATIVE DIAGNOSES: Intrauterine pregnancy at [redacted]w[redacted]d weeks gestation; malpresentation: breech  POSTOPERATIVE DIAGNOSES: The same  PROCEDURE: Low Transverse Cesarean Section w/ Bilateral tubal ligation/salpingectomy   SURGEON:  Dr. Nettie Elm  ASSISTANT:  Dr. Salvadore Dom. An experienced assistant was required given the standard of surgical care given the complexity of the case.  This assistant was needed for exposure, dissection, suctioning, retraction, instrument exchange, assisting with delivery with administration of fundal pressure, and for overall help during the procedure.  ANESTHESIOLOGY TEAM: Anesthesiologist: Elmer Picker, MD CRNA: Lenox Ahr, CRNA  INDICATIONS: Becky Mclaughlin is a 21 y.o. G2P1001 at [redacted]w[redacted]d here for cesarean section secondary to the indications listed under preoperative diagnoses; please see preoperative note for further details.  The risks of surgery were discussed with the patient including but were not limited to: bleeding which may require transfusion or reoperation; infection which may require antibiotics; injury to bowel, bladder, ureters or other surrounding organs; injury to the fetus; need for additional procedures including hysterectomy in the event of a life-threatening hemorrhage; formation of adhesions; placental abnormalities wth subsequent pregnancies; incisional problems; thromboembolic phenomenon and other postoperative/anesthesia complications.  The patient concurred with the proposed plan, giving informed written consent for the procedure.    FINDINGS:  Viable female infant in cephalic presentation.  Apgars 8 and 9.  Clear amniotic fluid.  Intact placenta, three vessel cord.  Normal uterus, fallopian tubes and ovaries bilaterally.  ANESTHESIA: Spinal INTRAVENOUS FLUIDS: 2000 ml   ESTIMATED BLOOD LOSS: 198 ml URINE OUTPUT:  150 ml SPECIMENS: Placenta sent to L&D COMPLICATIONS: None  immediate  PROCEDURE IN DETAIL:  The patient preoperatively received intravenous antibiotics and had sequential compression devices applied to her lower extremities.  She was then taken to the operating room where spinal anesthesia was administered and was found to be adequate. She was then placed in a dorsal supine position with a leftward tilt, and prepped and draped in a sterile manner.  A foley catheter was placed into her bladder and attached to constant gravity.  After an adequate timeout was performed, a Pfannenstiel skin incision was made with scalpel on her preexisting scar and carried through to the underlying layer of fascia. The fascia was incised in the midline, and this incision was extended bilaterally using the Mayo scissors.  Kocher clamps were applied to the superior aspect of the fascial incision and the underlying rectus muscles were dissected off bluntly and sharply.  A similar process was carried out on the inferior aspect of the fascial incision. The rectus muscles were separated in the midline and the peritoneum was entered bluntly. The Alexis self-retaining retractor was introduced into the abdominal cavity.  Attention was turned to the lower uterine segment where a low transverse hysterotomy was made with a scalpel and extended bilaterally bluntly.  The infant was successfully delivered via breech presentation, the cord was clamped and cut after one minute, and the infant was handed over to the awaiting neonatology team. Uterine massage was then administered, and the placenta delivered intact with a three-vessel cord. The uterus was then cleared of clots and debris.  The hysterotomy was closed in a running locked fashion with 0 plain gut suture and good hemostasis achieved. The pelvis was cleared of all clot and debris.  Attention was then turned to the fallopian tubes. [place]: The left Fallopian tube was identified by tracing out to the fimbriae using Babcock clamps. An avascular  midsection of the tube approximately 3-4cm  from the cornua was grasped with the babcock clamps and a window was developed in the mesosalpinx taking care to avoid vascular structures. The distal and proximal aspects were ligated with a suture of 3-0 plain gut, and the intervening portion of tube was transected and removed with Metzenbaum scissors.  Attention was then turned to the right fallopian tube after confirmation by tracing the tube out to the fimbriae. The same procedure was then performed on the right Fallopian tube, with excellent hemostasis was noted from both BTL sites.   Hemostasis was confirmed on all surfaces.  The retractor was removed.  The peritoneum was closed with a 0 Vicryl running stitch and the rectus muscles were incorporated. The fascia was then closed using 0 Vicryl.  The subcutaneous layer was irrigated, reapproximated with 2-0 plain gut interrupted stitches, and the skin was closed with a 4-0 Vicryl subcuticular stitch. The patient tolerated the procedure well. Sponge, instrument and needle counts were correct x 3.  She was taken to the recovery room in stable condition.   Lavonda Jumbo, DO OB Fellow, Faculty [facility], [facility] 10/18/2021, 9:20 PM

## 2021-10-18 NOTE — H&P (Signed)
Obstetric Preoperative History and Physical  Becky Mclaughlin is a 21 y.o. G2P1001 with IUP at [redacted]w[redacted]d presenting for scheduled cesarean section.  Reports good fetal movement, no bleeding, no contractions, no leaking of fluid.  No acute preoperative concerns.    Cesarean Section Indication: malpresentation: Breech  Prenatal Course  Nursing Staff Provider  Office Location  [facility] Dating  LMP  [facility] Model [x]  Traditional [ ]  Centering [ ]  Mom-Baby Dyad    Language   ENGLISH Anatomy US    Flu Vaccine   DECLINED 04/23/21 Genetic/Carrier Screen  NIPS:   LR NIPS female AFP:    Horizon:  TDaP Vaccine   Given 08/09/21 Hgb A1C or  GTT Early  Third trimester: normal  COVID Vaccine NO   LAB RESULTS   Rhogam  08/09/21 Blood Type O/Negative/-- (03/03 1044)   Baby Feeding Plan  BREASTFEED Antibody Negative (03/03 1044)  Contraception  VASECTOMY?/IUD Rubella 6.29 (03/03 1044)  Circumcision  YES RPR Non Reactive (06/19 1108)   Pediatrician   [facility] HBsAg Negative (03/03 1044)   Support Person  FOB HCVAb Negative (03/03 1044)  Prenatal Classes  HIV Non Reactive (06/19 1108)     BTL Consent  GBS  NEGATIVE   VBAC Consent  Pap 10/2020 LSIL s/p colpo       DME Rx [X]  BP cuff [X]  Weight Scale Waterbirth  [ ]  Class [ ]  Consent [ ]  CNM visit  [place] Valu.Nieves ] new OB Valu.Nieves  ] 28 weeks  [  ] 36 weeks Induction  [ ]  Orders Entered [ ] Foley Y/N   Source of Care: [facility] with onset of care at 13.2 weeks Pregnancy complications or risks: Patient Active Problem List   Diagnosis Date Noted   History of cesarean delivery, currently pregnant 10/13/2021   Pregnancy complicated by fetal cerebral ventriculomegaly, fetus 1 10/06/2021   Anxiety in pregnancy in third trimester, antepartum 08/16/2021   Supervision of other normal pregnancy, antepartum 04/15/2021   Abnormal Pap smear of cervix 11/24/2020   History of C-section 07/31/2020   Rh negative status during pregnancy 02/08/2020   Depressive disorder 02/13/2019    Generalized anxiety disorder with panic attacks 11/29/2018   Precordial pain 10/22/2018   Gastroesophageal reflux disease without esophagitis 05/09/2018   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   Prenatal labs and studies: ABO, Rh: --/--/O NEG (08/28 1039) Antibody: POS (08/28 1039) Rubella: 6.29 (03/03 1044) RPR: Non Reactive (06/19 1108)  HBsAg: Negative (03/03 1044)  HIV: Non Reactive (06/19 1108)  [id number]-- (08/09 1459) 2 hr Glucola  nml Genetic screening normal Anatomy US normal  Prenatal Transfer Tool  Maternal Diabetes: No Genetic Screening: Normal Maternal Ultrasounds/Referrals: Normal Fetal Ultrasounds or other Referrals:  None Maternal Substance Abuse:  No Significant Maternal Medications:  None Significant Maternal Lab Results: Group B Strep negative  Past Medical History:  Diagnosis Date   Acid reflux    Anxiety    Kidney disease    Vaginal Pap smear, abnormal     Past Surgical History:  Procedure Laterality Date   CESAREAN SECTION N/A 07/31/2020   Procedure: CESAREAN SECTION;  Surgeon: Gwynne Edinger, MD;  Location: [facility] LD ORS;  Service: Obstetrics;  Laterality: N/A;   MOUTH SURGERY  2018   MULTIPLE TOOTH EXTRACTIONS      OB History  Gravida Para Term Preterm AB Living  2 1 1     1   SAB  IAB Ectopic Multiple Live Births        0 1    # Outcome Date GA Lbr Len/2nd Weight Sex Delivery Anes PTL Lv  2 Current           1 Term 07/31/20 [redacted]w[redacted]d  3116 g F CS-LTranv Spinal  LIV    Social History   Socioeconomic History   Marital status: Single    Spouse name: Not on file   Number of children: 1   Years of education: Not on file   Highest education level: Not on file  Occupational History   Not on file  Tobacco Use   Smoking status: Never   Smokeless tobacco: Never  Vaping Use   Vaping Use: Never used  Substance and Sexual Activity   Alcohol use: Not  Currently    Comment: occasional   Drug use: Never   Sexual activity: Yes  Other Topics Concern   Not on file  Social History Narrative   Not on file   Social Determinants of Health   Financial Resource Strain: Not on file  Food Insecurity: Not on file  Transportation Needs: Not on file  Physical Activity: Not on file  Stress: Not on file  Social Connections: Not on file    Family History  Problem Relation Age of Onset   Hypertension Mother        "had hole in her heart"   Hypertension Father    Stroke Cousin        At age 41., used to vape    Medications Prior to Admission  Medication Sig Dispense Refill Last Dose   cyanocobalamin (VITAMIN B12) 1000 MCG tablet Take 1,000 mcg by mouth daily.   10/18/2021   famotidine (PEPCID) 20 MG tablet Take 1 tablet (20 mg total) by mouth 2 (two) times daily as needed for heartburn or indigestion. 60 tablet 0 10/17/2021   magnesium oxide (MAG-OX) 400 (240 Mg) MG tablet Take 400 mg by mouth daily.   10/17/2021   Prenatal Vit-Fe Fumarate-FA (MULTIVITAMIN-PRENATAL) 27-0.8 MG TABS tablet Take 1 tablet by mouth in the morning.   10/18/2021    Allergies  Allergen Reactions   Fluconazole Other (See Comments)    Syncopal episode and , hot flashes and feeling lightheaded, jittery    Review of Systems: Pertinent items noted in HPI and remainder of comprehensive ROS otherwise negative.  Physical Exam: BP 132/80 (BP Location: Right Arm)   Pulse 94   Temp 98 F (36.7 C) (Oral)   Resp 17   Ht 5\' 9"  (1.753 m)   Wt 80.5 kg   LMP 01/20/2021 (Exact Date)   SpO2 98%   BMI 26.21 kg/m  FHR by Doppler: 120 bpm CONSTITUTIONAL: Well-developed, well-nourished female in no acute distress.  HENT:  Normocephalic, atraumatic, External right and left ear normal. Oropharynx is clear and moist EYES: Conjunctivae and EOM are normal. Pupils are equal, round, and reactive to light. No scleral icterus.  NECK: Normal range of motion, supple, no masses SKIN:  Skin is warm and dry. No rash noted. Not diaphoretic. No erythema. No pallor. NEUROLOGIC: Alert and oriented to person, place, and time. Normal reflexes, muscle tone coordination. No cranial nerve deficit noted. PSYCHIATRIC: Normal mood and affect. Normal behavior. Normal judgment and thought content. CARDIOVASCULAR: Normal heart rate noted, regular rhythm RESPIRATORY: Effort and breath sounds normal, no problems with respiration noted ABDOMEN: Soft, nontender, nondistended, gravid. Well-healed Pfannenstiel incision. PELVIC: Deferred MUSCULOSKELETAL: Normal range of motion. No edema and  no tenderness. 2+ distal pulses.  Pertinent Labs/Studies:   Results for orders placed or performed during the hospital encounter of 10/18/21 (from the past 72 hour(s))  Type and screen  [facility]     Status: None   Collection Time: 10/18/21 10:39 AM  Result Value Ref Range   ABO/RH(D) O NEG    Antibody Screen POS    Sample Expiration 10/21/2021,2359    Antibody Identification      PASSIVELY ACQUIRED ANTI-D Performed at [facility], [address] [place], [place] [address]   CBC     Status: Abnormal   Collection Time: 10/18/21 10:40 AM  Result Value Ref Range   WBC 11.0 (H) 4.0 - 10.5 K/uL   RBC 4.43 3.87 - 5.11 MIL/uL   Hemoglobin 12.6 12.0 - 15.0 g/dL   HCT 63.8 75.6 - 43.3 %   MCV 86.5 80.0 - 100.0 fL   MCH 28.4 26.0 - 34.0 pg   MCHC 32.9 30.0 - 36.0 g/dL   RDW 29.5 18.8 - 41.6 %   Platelets 336 150 - 400 K/uL   nRBC 0.0 0.0 - 0.2 %    Comment: Performed at [facility], [address] [place], [place] [address]    Assessment and Plan: Becky Mclaughlin is a 21 y.o. G2P1001 at [redacted]w[redacted]d being admitted for scheduled cesarean section. The risks of surgery were discussed with the patient including but were not limited to: bleeding which may require transfusion or reoperation; infection which may require antibiotics; injury to bowel, bladder, ureters or other surrounding  organs; injury to the fetus; need for additional procedures including hysterectomy in the event of a life-threatening hemorrhage; formation of adhesions; placental abnormalities wth subsequent pregnancies; incisional problems; thromboembolic phenomenon and other postoperative/anesthesia complications. The patient concurred with the proposed plan, giving informed written consent for the procedure. Patient has been NPO since last night she will remain NPO for procedure. Anesthesia and OR aware. Preoperative prophylactic antibiotics and SCDs ordered on call to the OR. To OR when ready.    Myrtie Hawk, DO FMOB Fellow, Faculty practice [facility], Center for [facility] 10/18/21  7:11 PM

## 2021-10-18 NOTE — MAU Note (Signed)
Becky Mclaughlin is a 21 y.o. at [redacted]w[redacted]d here in MAU reporting: ctx's started around 3, no every 4-10 min. No bleeding or LOF. No recent exams, reports +FM.  For repeat c/s, baby is breech. Had blood work drawn this morning, scheduled for Wed.   Onset of complaint: 1500 Pain score: 7 Vitals:   10/18/21 1840  BP: 132/80  Pulse: 94  Resp: 17  Temp: 98 F (36.7 C)  SpO2: 98%     FHT:120 Lab orders placed from triage:

## 2021-10-18 NOTE — Lactation Note (Signed)
This note was copied from a baby's chart. Lactation Consultation Note  Patient Name: Becky Mclaughlin [id number] Date: 10/18/2021   Age:23 hours Mom chooses to formula feed.  Maternal Data    Feeding    LATCH Score                    Lactation Tools Discussed/Used    Interventions    Discharge    Consult Status Consult Status: Complete    Adolfo Granieri, [place] 10/18/2021, 9:33 PM

## 2021-10-19 ENCOUNTER — Encounter (HOSPITAL_COMMUNITY): Payer: Self-pay | Admitting: Obstetrics and Gynecology

## 2021-10-19 DIAGNOSIS — Z98891 History of uterine scar from previous surgery: Secondary | ICD-10-CM

## 2021-10-19 LAB — CBC
HCT: 35.8 % — ABNORMAL LOW (ref 36.0–46.0)
Hemoglobin: 11.6 g/dL — ABNORMAL LOW (ref 12.0–15.0)
MCH: 28.2 pg (ref 26.0–34.0)
MCHC: 32.4 g/dL (ref 30.0–36.0)
MCV: 87.1 fL (ref 80.0–100.0)
Platelets: 318 10*3/uL (ref 150–400)
RBC: 4.11 MIL/uL (ref 3.87–5.11)
RDW: 13.5 % (ref 11.5–15.5)
WBC: 15.9 10*3/uL — ABNORMAL HIGH (ref 4.0–10.5)
nRBC: 0 % (ref 0.0–0.2)

## 2021-10-19 LAB — RPR: RPR Ser Ql: NONREACTIVE

## 2021-10-19 MED ORDER — DIPHENHYDRAMINE HCL 25 MG PO CAPS: 25.0000 mg | ORAL_CAPSULE | Freq: Four times a day (QID) | ORAL | Status: DC | PRN

## 2021-10-19 MED ORDER — SENNOSIDES-DOCUSATE SODIUM 8.6-50 MG PO TABS
2.0000 | ORAL_TABLET | Freq: Every day | ORAL | Status: DC
Start: 2021-10-19 — End: 2021-10-21
  Administered 2021-10-19 – 2021-10-21 (×3): 2 via ORAL
  Filled 2021-10-19 (×3): qty 2

## 2021-10-19 MED ORDER — DIBUCAINE (PERIANAL) 1 % EX OINT: 1.0000 | TOPICAL_OINTMENT | CUTANEOUS | Status: DC | PRN

## 2021-10-19 MED ORDER — OXYTOCIN-SODIUM CHLORIDE 30-0.9 UT/500ML-% IV SOLN: 2.5000 [IU]/h | INTRAVENOUS | Status: AC

## 2021-10-19 MED ORDER — WITCH HAZEL-GLYCERIN EX PADS
1.0000 | MEDICATED_PAD | CUTANEOUS | Status: DC | PRN
Start: 2021-10-19 — End: 2021-10-21

## 2021-10-19 MED ORDER — ACETAMINOPHEN 500 MG PO TABS
1000.0000 mg | ORAL_TABLET | Freq: Four times a day (QID) | ORAL | Status: DC
  Administered 2021-10-19 – 2021-10-21 (×8): 1000 mg via ORAL
  Filled 2021-10-19 (×7): qty 2

## 2021-10-19 MED ORDER — COCONUT OIL OIL: 1.0000 | TOPICAL_OIL | Status: DC | PRN

## 2021-10-19 MED ORDER — OXYCODONE HCL 5 MG PO TABS
5.0000 mg | ORAL_TABLET | ORAL | Status: DC | PRN
  Administered 2021-10-21: 5 mg via ORAL
  Filled 2021-10-19: qty 1

## 2021-10-19 MED ORDER — SIMETHICONE 80 MG PO CHEW
80.0000 mg | CHEWABLE_TABLET | Freq: Three times a day (TID) | ORAL | Status: DC
  Administered 2021-10-19 – 2021-10-21 (×8): 80 mg via ORAL
  Filled 2021-10-19 (×8): qty 1

## 2021-10-19 MED ORDER — TETANUS-DIPHTH-ACELL PERTUSSIS 5-2.5-18.5 LF-MCG/0.5 IM SUSY: 0.5000 mL | PREFILLED_SYRINGE | Freq: Once | INTRAMUSCULAR | Status: DC

## 2021-10-19 MED ORDER — SIMETHICONE 80 MG PO CHEW: 80.0000 mg | CHEWABLE_TABLET | ORAL | Status: DC | PRN

## 2021-10-19 MED ORDER — IBUPROFEN 600 MG PO TABS
600.0000 mg | ORAL_TABLET | Freq: Four times a day (QID) | ORAL | Status: DC
  Administered 2021-10-20 – 2021-10-21 (×5): 600 mg via ORAL
  Filled 2021-10-19 (×5): qty 1

## 2021-10-19 MED ORDER — MENTHOL 3 MG MT LOZG: 1.0000 | LOZENGE | OROMUCOSAL | Status: DC | PRN

## 2021-10-19 MED ORDER — KETOROLAC TROMETHAMINE 30 MG/ML IJ SOLN
30.0000 mg | Freq: Four times a day (QID) | INTRAMUSCULAR | Status: AC
  Administered 2021-10-19 (×3): 30 mg via INTRAVENOUS
  Filled 2021-10-19 (×4): qty 1

## 2021-10-19 MED ORDER — PRENATAL MULTIVITAMIN CH
1.0000 | ORAL_TABLET | Freq: Every day | ORAL | Status: DC
Start: 2021-10-19 — End: 2021-10-21
  Administered 2021-10-19 – 2021-10-21 (×3): 1 via ORAL
  Filled 2021-10-19 (×3): qty 1

## 2021-10-19 MED ORDER — ENOXAPARIN SODIUM 40 MG/0.4ML IJ SOSY
40.0000 mg | PREFILLED_SYRINGE | INTRAMUSCULAR | Status: DC
  Administered 2021-10-19 – 2021-10-20 (×2): 40 mg via SUBCUTANEOUS
  Filled 2021-10-19 (×3): qty 0.4

## 2021-10-19 NOTE — Progress Notes (Addendum)
POSTPARTUM PROGRESS NOTE  Subjective: Becky Mclaughlin is a 21 y.o. [id number] s/p rLTCS for breech at [redacted]w[redacted]d.  She reports she is doing well. No acute events overnight. She denies any problems with ambulating, voiding or po intake. Denies nausea or vomiting. She has not passed flatus. She has not had bowel movement. Pain is well controlled. Lochia is Small. Endorses some lightheadness that improved when she lay back down in bed. Starting to increase oral intake.  Objective: BP 114/65 (BP Location: Right Arm)   Pulse 88   Temp 98.1 F (36.7 C)   Resp 18   Ht 5\' 9"  (1.753 m)   Wt 80.5 kg   LMP 01/20/2021 (Exact Date)   SpO2 96%   Breastfeeding Unknown   BMI 26.21 kg/m   Physical Exam:  General: alert, cooperative and no distress Chest: no respiratory distress Abdomen: soft, non-tender  incision honeycomb bandage clean dry and intact  Uterine Fundus: firm, appropriately tender Extremities: No calf swelling or tenderness  no peripheral edema  Recent Labs    10/18/21 1040 10/19/21 0801  HGB 12.6 11.6*  HCT 38.3 35.8*    Assessment/Plan: Becky Mclaughlin is a 21 y.o. 21 s/p rLTCS at [redacted]w[redacted]d for breech presentation.  POD#1: Doing well, pain well-controlled. H/H appropriate.  -- Routine postpartum care, lactation support -- Encouraged up OOB -- Lovenox for VTE prophylaxis -- Contraception:  BTL done PP -- Feeding: breast feeding -- Circumcision: yes  Dispo: Plan for discharge, likely tomorrow.  [redacted]w[redacted]d, MD, PGY-1 [facility] 9:06 AM 10/19/2021  __________________ GME ATTESTATION:  Evaluation and management procedures were performed by the [facility] Medicine Resident under my supervision. I was immediately available for direct supervision, assistance and direction throughout this encounter.  I also confirm that I have verified the information documented in the resident's note, and that I have also personally reperformed the pertinent components of the  physical exam and all of the medical decision making activities.  I have also made any necessary editorial changes.  [facility], DO OB Fellow, Faculty [facility], Center for [facility] 10/19/2021 1:12 PM

## 2021-10-20 ENCOUNTER — Encounter (HOSPITAL_COMMUNITY): Admission: AD | Payer: Self-pay | Source: Home / Self Care

## 2021-10-20 ENCOUNTER — Ambulatory Visit: Payer: BC Managed Care – PPO | Admitting: Licensed Clinical Social Worker

## 2021-10-20 ENCOUNTER — Inpatient Hospital Stay (HOSPITAL_COMMUNITY)
Admission: AD | Admit: 2021-10-20 | Payer: BC Managed Care – PPO | Source: Home / Self Care | Admitting: Obstetrics and Gynecology

## 2021-10-20 ENCOUNTER — Encounter: Payer: BC Managed Care – PPO | Admitting: Obstetrics and Gynecology

## 2021-10-20 DIAGNOSIS — O34219 Maternal care for unspecified type scar from previous cesarean delivery: Secondary | ICD-10-CM

## 2021-10-20 DIAGNOSIS — Z98891 History of uterine scar from previous surgery: Secondary | ICD-10-CM

## 2021-10-20 LAB — SURGICAL PATHOLOGY

## 2021-10-20 SURGERY — Surgical Case
Anesthesia: Regional

## 2021-10-20 NOTE — Progress Notes (Addendum)
POSTPARTUM PROGRESS NOTE   Subjective: Becky Mclaughlin is a 21 y.o. [id number] s/p rLTCS for breech at [redacted]w[redacted]d.  She reports she is doing well. No acute events overnight. She denies any problems with ambulating, voiding or po intake. Denies nausea or vomiting. She has not passed flatus. She has not had bowel movement. Pain is well controlled. Lochia is Small. Improved lightheadness overnight. Tolerating PO. Would like to stay one more day.    Objective: Vitals:   10/19/21 1410 10/19/21 2100  BP: (!) 98/53 116/73  Pulse: 77 77  Resp: 17 17  Temp: 98 F (36.7 C) 98.3 F (36.8 C)  SpO2:  100%     Physical Exam:  General: alert, cooperative and no distress Chest: no respiratory distress Abdomen: soft, non-tender  incision honeycomb bandage clean dry and intact  Uterine Fundus: firm, appropriately tender Extremities: No calf swelling or tenderness  no peripheral edema   Recent Labs (last 2 labs)       Recent Labs    10/18/21 1040 10/19/21 0801  HGB 12.6 11.6*  HCT 38.3 35.8*        Assessment/Plan: Becky Mclaughlin is a 21 y.o. [id number] s/p rLTCS at [redacted]w[redacted]d for breech presentation.   POD#1: Doing well, pain well-controlled. H/H appropriate. Slow ambulation with some transient lightheadedness. -- Routine postpartum care, lactation support -- Encouraged up OOB -- Lovenox for VTE prophylaxis -- Contraception:  BTL done PP -- Feeding: breast feeding -- Circumcision: yes   Dispo: Plan for discharge, likely tomorrow or possibly late today.  Wyn Forster MD PGY3  I spoke with and examined patient and agree with resident/PA-S/MS/SNM's note and plan of care. Wants to stay for now, will let us know if she decides to go home later this evening. Cheral Marker, CNM, WHNP-BC 10/20/2021 8:00 AM   CNM Circumcision Counseling Progress Note  Patient desires circumcision for her female infant.  Circumcision procedure details discussed, risks and benefits of procedure were also discussed.   These include but are not limited to: Benefits of circumcision in men include reduction in the rates of urinary tract infection (UTI), penile cancer, some sexually transmitted infections, penile inflammatory and retractile disorders, as well as easier hygiene.  Risks include bleeding , infection, injury of glans which may lead to penile deformity or urinary tract issues, unsatisfactory cosmetic appearance and other potential complications related to the procedure.  It was emphasized that this is an elective procedure.  Patient wants to proceed with circumcision; written informed consent will be obtained.  Will have MD do circumcision when infant is cleared for such by peds.  Joellyn Haff, CNM, [facility] 10/20/2021   7:59 AM

## 2021-10-21 MED ORDER — ACETAMINOPHEN 325 MG PO TABS: 650.0000 mg | ORAL_TABLET | Freq: Four times a day (QID) | ORAL | 0 refills | Status: AC

## 2021-10-21 MED ORDER — OXYCODONE HCL 5 MG PO TABS
5.0000 mg | ORAL_TABLET | ORAL | 0 refills | Status: AC | PRN
Start: 2021-10-21 — End: ?

## 2021-10-21 MED ORDER — IBUPROFEN 600 MG PO TABS: 600.0000 mg | ORAL_TABLET | Freq: Four times a day (QID) | ORAL | 0 refills | Status: AC

## 2021-10-21 NOTE — Social Work (Signed)
CSW received verbal consult for hx of Anxiety and Depression.  CSW met with MOB to offer support and complete assessment.    CSW met with MOB at bedside and introduced CSW role. CSW observed MOB lying on couch, the infant asleep in bassinet, maternal grandfather, and FOB present at bedside. CSW offered MOB privacy. MOB preferred FOB and her dad stay in the room. MOB presented calm and engaged with CSW during the visit. CSW inquired how MOB has felt since giving birth. MOB reported having a headache that has not subsided with Tylenol and Motrin. She has only found relief in lying down. MOB reported the labor and delivery went well with no concerns. CSW inquired how MOB felt during the pregnancy. MOB stated, "I took it day by day." MOB acknowledged that she has history of anxiety and ADD. She denied history of depression. MOB shared that she was diagnosed with anxiety and ADD in 2021. MOB reported she sees a psychiatrist at the [facility] for medication management for Zoloft and Adderall. She sees the psychiatrist that is on at the time of her appointment. MOB reported she did not take the Zoloft or Adderall during the pregnancy. MOB reported she self manages her mood by "talking with my spouse who understands." MOB reported her spouse, mom, dad and in laws are all supportive. CSW inquired if MOB had PPD/PPA with her older child. MOB reported that she had anxiety. CSW provided education regarding the baby blues period vs. perinatal mood disorders, discussed treatment and gave resources for mental health follow up if concerns arise.  MOB reported she is not currently interested in therapy but will follow up as needed. CSW recommended MOB complete a self-evaluation during the postpartum time period using the New Mom Checklist from Postpartum Progress and encouraged MOB to contact a medical professional if symptoms are noted at any time. CSW assessed MOB for safety. MOB denied thoughts of harm to  self and others.   MOB reported she has all essential items to care for the infant. CSW provided review of Sudden Infant Death Syndrome (SIDS) precautions. MOB has chosen [employer] [facility] for the infant's follow up care. CSW assessed MOB for additional needs. MOB report no further needs.   CSW identifies no further need for intervention and no barriers to discharge at this time.  Kathrin Greathouse, MSW, LCSW [facility] [place] Worker  [phone] 01-06-2022  3:27 PM

## 2021-10-26 ENCOUNTER — Ambulatory Visit (INDEPENDENT_AMBULATORY_CARE_PROVIDER_SITE_OTHER): Payer: BC Managed Care – PPO | Admitting: General Practice

## 2021-10-26 VITALS — BP 109/78 | HR 88 | Ht 69.0 in | Wt 157.8 lb

## 2021-10-26 DIAGNOSIS — Z4889 Encounter for other specified surgical aftercare: Secondary | ICD-10-CM

## 2021-10-26 NOTE — Progress Notes (Signed)
The wound is cleansed, debrided of foreign material as much as possible, and dressed. The patient is alerted to watch for any signs of infection (redness, pus, pain, increased swelling or fever) and call if such occurs. Home wound care instructions are provided. Tetanus vaccination status reviewed: last tetanus booster within 10 years.   Honeycomb dressing removed, steri strips in place. No odor noticed upon dressing removal but no signs for infection noticed. Pt advised to shower as normal and allow steri strip to fall off. Signs for infection reviewed with patient and advice to call the office if she experiences any of these symptoms. Pt verbalize understanding.

## 2021-10-27 ENCOUNTER — Encounter: Payer: BC Managed Care – PPO | Admitting: Obstetrics & Gynecology

## 2021-12-01 ENCOUNTER — Encounter: Payer: Self-pay | Admitting: Advanced Practice Midwife

## 2021-12-01 ENCOUNTER — Other Ambulatory Visit (HOSPITAL_COMMUNITY)
Admission: RE | Admit: 2021-12-01 | Discharge: 2021-12-01 | Disposition: A | Discharge status: Home / Self Care | Payer: BC Managed Care – PPO | Source: Ambulatory Visit | Attending: Advanced Practice Midwife | Admitting: Advanced Practice Midwife

## 2021-12-01 ENCOUNTER — Ambulatory Visit (INDEPENDENT_AMBULATORY_CARE_PROVIDER_SITE_OTHER): Payer: BC Managed Care – PPO | Admitting: Advanced Practice Midwife

## 2021-12-01 VITALS — BP 106/72 | HR 71 | Ht 69.0 in | Wt 150.0 lb

## 2021-12-01 DIAGNOSIS — R87612 Low grade squamous intraepithelial lesion on cytologic smear of cervix (LGSIL): Secondary | ICD-10-CM | POA: Diagnosis present

## 2021-12-01 DIAGNOSIS — F418 Other specified anxiety disorders: Secondary | ICD-10-CM

## 2021-12-01 DIAGNOSIS — O99345 Other mental disorders complicating the puerperium: Secondary | ICD-10-CM | POA: Diagnosis not present

## 2021-12-01 MED ORDER — SERTRALINE HCL 50 MG PO TABS: 50.0000 mg | ORAL_TABLET | Freq: Every day | ORAL | 2 refills | Status: DC

## 2021-12-01 MED ORDER — SERTRALINE HCL 25 MG PO TABS: 25.0000 mg | ORAL_TABLET | Freq: Every day | ORAL | 0 refills | Status: AC

## 2021-12-01 NOTE — Progress Notes (Signed)
Post Partum Visit Note  Becky Mclaughlin is a 21 y.o. G31P2002 female who presents for a postpartum visit. She is 6 weeks postpartum following a repeat cesarean section.  I have fully reviewed the prenatal and intrapartum course. The delivery was at 38.5 gestational weeks.  Anesthesia: spinal. Postpartum course has been unremarkable. Baby is doing well. Baby is feeding by bottle - Similac Advance. Bleeding no bleeding. Bowel function is normal. Bladder function is normal. Patient is not sexually active. Contraception method is tubal ligation done at Hospital. Postpartum depression screening: negative.  Last PAP 10/2020 LSIL   The pregnancy intention screening data noted above was reviewed. Potential methods of contraception were discussed. The patient elected to proceed with No data recorded.   Edinburgh Postnatal Depression Scale - 12/01/21 1604       Edinburgh Postnatal Depression Scale:  In the Past 7 Days   I have been able to laugh and see the funny side of things. 0    I have looked forward with enjoyment to things. 0    I have blamed myself unnecessarily when things went wrong. 0    I have been anxious or worried for no good reason. 2    I have felt scared or panicky for no good reason. 2    Things have been getting on top of me. 0    I have been so unhappy that I have had difficulty sleeping. 0    I have felt sad or miserable. 0    I have been so unhappy that I have been crying. 1    The thought of harming myself has occurred to me. 0    Edinburgh Postnatal Depression Scale Total 5             Health Maintenance Due  Topic Date Due   HPV VACCINES (1 - 2-dose series) Never done   INFLUENZA VACCINE  09/21/2021    The following portions of the patient's history were reviewed and updated as appropriate: allergies, current medications, past family history, past medical history, past social history, past surgical history, and problem list.  Review of Systems Pertinent  items noted in HPI and remainder of comprehensive ROS otherwise negative.  Objective:  BP 106/72   Pulse 71   Ht 5\' 9"  (1.753 m)   Wt 150 lb (68 kg)   LMP 01/20/2021 (Exact Date)   Breastfeeding No   BMI 22.15 kg/m    VS reviewed, nursing note reviewed,  Constitutional: well developed, well nourished, no distress HEENT: normocephalic CV: normal rate Pulm/chest wall: normal effort Abdomen: soft Neuro: alert and oriented x 3 Skin: warm, dry Psych: affect normal  Pelvic exam: Cervix pink, visually closed, without lesion, scant white creamy discharge, vaginal walls and external genitalia normal   Assessment:   1. Postpartum care and examination --Doing well overall, bonding well with baby, good support at home  2. LGSIL on Pap smear of cervix --Repeat Pap today - Cytology - PAP( [facility])  3. Postpartum anxiety --Pt reports intermittent episodes of anxiety.  She took Zoloft in the past which helped. --Restart Zoloft, 25 mg x 1 week then 50 mg daily --Reviewed risks, pt to notify if any thoughts of harming herself --F/U in office in 1 month  - sertraline (ZOLOFT) 25 MG tablet; Take 1 tablet (25 mg total) by mouth daily for 7 days.  Dispense: 7 tablet; Refill: 0 - sertraline (ZOLOFT) 50 MG tablet; Take 1 tablet (50 mg total) by  mouth daily.  Dispense: 30 tablet; Refill: 2   Plan:   Essential components of care per ACOG recommendations:  1.  Mood and well being: Patient with negative depression screening today. Reviewed local resources for support.  - Patient tobacco use? No.   - hx of drug use? No.    2. Infant care and feeding:  -Patient currently breastmilk feeding? No.  -Social determinants of health (SDOH) reviewed in EPIC. No concerns   3. Sexuality, contraception and birth spacing - Patient does not want a pregnancy in the next year.  Desired family size is 2 children.  - Reviewed reproductive life planning. Reviewed contraceptive methods based on pt  preferences and effectiveness.  Patient had BTL in hospital prior to discharge.  4. Sleep and fatigue -Encouraged family/partner/community support of 4 hrs of uninterrupted sleep to help with mood and fatigue  5. Physical Recovery  - Discussed patients delivery and complications.  - Patient had a C-section repeat; no problems after delivery. Patient expressed understanding - Patient has urinary incontinence? No. - Patient is safe to resume physical and sexual activity  6.  Health Maintenance - HM due items addressed Yes - Last pap smear  Diagnosis  Date Value Ref Range Status  11/17/2020 - Low grade squamous intraepithelial lesion (LSIL) (A)  Final   Pap smear done at today's visit.  -Breast Cancer screening indicated? No.   7. Chronic Disease/Pregnancy Condition follow up:  Anxiety  - PCP follow up   Center for [place]

## 2021-12-05 DIAGNOSIS — F418 Other specified anxiety disorders: Secondary | ICD-10-CM | POA: Insufficient documentation

## 2021-12-08 LAB — CYTOLOGY - PAP

## 2022-01-03 ENCOUNTER — Ambulatory Visit: Payer: BC Managed Care – PPO | Admitting: Advanced Practice Midwife

## 2022-01-17 ENCOUNTER — Ambulatory Visit (INDEPENDENT_AMBULATORY_CARE_PROVIDER_SITE_OTHER): Payer: BC Managed Care – PPO | Admitting: Obstetrics and Gynecology

## 2022-01-17 ENCOUNTER — Encounter: Payer: Self-pay | Admitting: Obstetrics and Gynecology

## 2022-01-17 ENCOUNTER — Other Ambulatory Visit (HOSPITAL_COMMUNITY)
Admission: RE | Admit: 2022-01-17 | Discharge: 2022-01-17 | Disposition: A | Discharge status: Home / Self Care | Payer: BC Managed Care – PPO | Source: Ambulatory Visit | Attending: Advanced Practice Midwife | Admitting: Advanced Practice Midwife

## 2022-01-17 VITALS — BP 102/68 | HR 88 | Ht 69.0 in | Wt 149.0 lb

## 2022-01-17 DIAGNOSIS — F418 Other specified anxiety disorders: Secondary | ICD-10-CM

## 2022-01-17 DIAGNOSIS — N761 Subacute and chronic vaginitis: Secondary | ICD-10-CM | POA: Diagnosis not present

## 2022-01-17 DIAGNOSIS — O99345 Other mental disorders complicating the puerperium: Secondary | ICD-10-CM

## 2022-01-17 NOTE — Progress Notes (Signed)
21 yo P2 returning for mood check. Patient was prescribed zoloft due to postpartum anxiety. She admits to taking it for 4 days in early November and self discontinued secondary to nausea. Patient reports first period on 12/24/21 and some blood tinged vaginal spotting. She is without any other complaints. Patient reports she has a good support system  Past Medical History:  Diagnosis Date   Acid reflux    Anxiety    Kidney disease    Vaginal Pap smear, abnormal    Past Surgical History:  Procedure Laterality Date   CESAREAN SECTION N/A 07/31/2020   Procedure: CESAREAN SECTION;  Surgeon: Kathrynn Running, MD;  Location: [facility];  Service: Obstetrics;  Laterality: N/A;   CESAREAN SECTION WITH BILATERAL TUBAL LIGATION N/A 10/18/2021   Procedure: CESAREAN SECTION WITH BILATERAL TUBAL LIGATION;  Surgeon: Hermina Staggers, MD;  Location: [facility];  Service: Obstetrics;  Laterality: N/A;   MOUTH SURGERY  2018   MULTIPLE TOOTH EXTRACTIONS     Family History  Problem Relation Age of Onset   Hypertension Mother        "had hole in her heart"   Hypertension Father    Stroke Cousin        At age 42., used to vape   Social History   Tobacco Use   Smoking status: Never   Smokeless tobacco: Never  Vaping Use   Vaping Use: Never used  Substance Use Topics   Alcohol use: Yes    Comment: occasional   Drug use: Never   ROS See pertinent in HPI. All other systems reviewed and non contributory Blood pressure 102/68, pulse 88, height 5\' 9"  (1.753 m), weight 149 lb (67.6 kg), last menstrual period 12/24/2021, not currently breastfeeding. GENERAL: Well-developed, well-nourished female in no acute distress.  NEURO: alert and oriented x 3  PHQ-9= 0 / GAD-7= 0   A/P 21 yo P2 with vaginitis and pp anxiety - vaginal swab collected - Patient agreed to restarting zoloft. Patient is not interested in speaking with a therapist currently - Advised to keep a menstrual calendar and return if menses  are not regular - RTC prn

## 2022-01-17 NOTE — Progress Notes (Signed)
21 y.o GYN presents for Mood Check.  C/o having a period twice this Month on 12/24/21  for 7 days and again on 01/04/22 x 6 days, cramps.  PHQ-9= 0 / GAD-7= 0

## 2022-01-18 LAB — CERVICOVAGINAL ANCILLARY ONLY
Bacterial Vaginitis (gardnerella): NEGATIVE
Candida Glabrata: NEGATIVE
Candida Vaginitis: NEGATIVE
Comment: NEGATIVE
Comment: NEGATIVE
Comment: NEGATIVE

## 2022-05-13 ENCOUNTER — Encounter: Payer: Self-pay | Admitting: Advanced Practice Midwife

## 2022-05-16 ENCOUNTER — Other Ambulatory Visit (HOSPITAL_BASED_OUTPATIENT_CLINIC_OR_DEPARTMENT_OTHER): Payer: Self-pay | Admitting: Advanced Practice Midwife

## 2022-05-16 DIAGNOSIS — O99345 Other mental disorders complicating the puerperium: Secondary | ICD-10-CM

## 2022-05-16 MED ORDER — SERTRALINE HCL 50 MG PO TABS: 50.0000 mg | ORAL_TABLET | Freq: Every day | ORAL | 11 refills | Status: AC

## 2022-09-15 ENCOUNTER — Encounter: Payer: Self-pay | Admitting: Obstetrics and Gynecology

## 2022-09-15 ENCOUNTER — Ambulatory Visit: Payer: Medicaid Other | Admitting: Obstetrics and Gynecology

## 2022-09-15 ENCOUNTER — Other Ambulatory Visit (HOSPITAL_COMMUNITY)
Admission: RE | Admit: 2022-09-15 | Discharge: 2022-09-15 | Disposition: A | Discharge status: Home / Self Care | Payer: Medicaid Other | Source: Ambulatory Visit | Attending: Obstetrics and Gynecology | Admitting: Obstetrics and Gynecology

## 2022-09-15 VITALS — BP 114/80 | HR 85 | Wt 128.0 lb

## 2022-09-15 DIAGNOSIS — G8929 Other chronic pain: Secondary | ICD-10-CM | POA: Insufficient documentation

## 2022-09-15 DIAGNOSIS — R1031 Right lower quadrant pain: Secondary | ICD-10-CM | POA: Diagnosis present

## 2022-09-15 NOTE — Progress Notes (Signed)
22 yo P2 with a 1 month history of right lower quadrant pain here for evaluation. Patient describes the pain as intermittent and sharp. It seems to be triggered by movement. Pain is also present during intercourse. She reports first noticing the pain when performing core exercises. Patient is without any other complaints. She denies nausea, urinary symptoms  Past Medical History:  Diagnosis Date   Acid reflux    Anxiety    Kidney disease    Vaginal Pap smear, abnormal    Past Surgical History:  Procedure Laterality Date   CESAREAN SECTION N/A 07/31/2020   Procedure: CESAREAN SECTION;  Surgeon: Kathrynn Running, MD;  Location: [facility];  Service: Obstetrics;  Laterality: N/A;   CESAREAN SECTION WITH BILATERAL TUBAL LIGATION N/A 10/18/2021   Procedure: CESAREAN SECTION WITH BILATERAL TUBAL LIGATION;  Surgeon: Hermina Staggers, MD;  Location: [facility];  Service: Obstetrics;  Laterality: N/A;   MOUTH SURGERY  2018   MULTIPLE TOOTH EXTRACTIONS     Family History  Problem Relation Age of Onset   Hypertension Mother        "had hole in her heart"   Hypertension Father    Stroke Cousin        At age 61., used to vape   Social History   Tobacco Use   Smoking status: Never   Smokeless tobacco: Never  Vaping Use   Vaping status: Never Used  Substance Use Topics   Alcohol use: Yes    Comment: occasional   Drug use: Never   ROS  See pertinent in HPI. All other systems reviewed and non contributory Blood pressure 114/80, pulse 85, weight 128 lb (58.1 kg), not currently breastfeeding. GENERAL: Well-developed, well-nourished female in no acute distress.  ABDOMEN: Soft, nontender, nondistended. No organomegaly. PELVIC: Normal external female genitalia. Vagina is pink and rugated.  Normal discharge. Normal appearing cervix. Uterus is normal in size. No adnexal mass or tenderness. Chaperone present during the pelvic exam EXTREMITIES: No cyanosis, clubbing, or edema, 2+ distal  pulses.  A/P 22 yo with chronic RLQ pain - vaginal swab collected to rule out infectious process - pelvic ultrasound ordered - Patient will be contacted with abnormal results

## 2022-09-15 NOTE — Progress Notes (Signed)
Pt presents with concerns of lower right pelvic pain x1 month.

## 2022-09-16 ENCOUNTER — Ambulatory Visit (HOSPITAL_BASED_OUTPATIENT_CLINIC_OR_DEPARTMENT_OTHER)
Admission: RE | Admit: 2022-09-16 | Discharge: 2022-09-16 | Disposition: A | Discharge status: Home / Self Care | Payer: Medicaid Other | Source: Ambulatory Visit | Attending: Obstetrics and Gynecology | Admitting: Obstetrics and Gynecology

## 2022-09-16 DIAGNOSIS — G8929 Other chronic pain: Secondary | ICD-10-CM | POA: Diagnosis present

## 2022-09-16 DIAGNOSIS — R1031 Right lower quadrant pain: Secondary | ICD-10-CM | POA: Diagnosis not present

## 2022-09-16 LAB — CERVICOVAGINAL ANCILLARY ONLY: Comment: NORMAL

## 2022-09-23 ENCOUNTER — Ambulatory Visit (HOSPITAL_BASED_OUTPATIENT_CLINIC_OR_DEPARTMENT_OTHER): Payer: Medicaid Other

## 2023-04-13 NOTE — Progress Notes (Unsigned)
ANNUAL EXAM Patient name: Becky Mclaughlin MRN [id number]  Date of birth: Jun 03, 2000 Chief Complaint:   No chief complaint on file.  History of Present Illness:   Becky Mclaughlin is a 23 y.o. [phone] female being seen today for a routine annual exam s/p BTL.  Current complaints: ***  No LMP recorded. (Menstrual status: Irregular Periods).   The pregnancy intention screening data noted above was reviewed. Potential methods of contraception were discussed. The patient elected to proceed with No data recorded.   Last pap 12/01/21 [facility]IL, 11/17/20 [facility] with LGSIL colposcopy. Results were: {Pap findings:25134}. H/O abnormal pap: {yes/yes***/no:23866} Last mammogram: ***. Results were: {normal, abnormal, n/a:23837}. Family h/o breast cancer: {yes***/no:23838} Last colonoscopy: ***. Results were: {normal, abnormal, n/a:23837}. Family h/o colorectal cancer: {yes***/no:23838} STI testing:  Contraception: BTL     01/17/2022   11:21 AM 08/09/2021    9:27 AM 04/16/2021    8:59 AM  Depression screen PHQ 2/9  Decreased Interest 0 0 0  Down, Depressed, Hopeless 0 0 0  PHQ - 2 Score 0 0 0  Altered sleeping 0 1 0  Tired, decreased energy 0 1 1  Change in appetite 0 0 0  Feeling bad or failure about yourself  0 0 0  Trouble concentrating 0 0 0  Moving slowly or fidgety/restless 0 0 0  Suicidal thoughts 0 0 0  PHQ-9 Score 0 2 1  Difficult doing work/chores Not difficult at all  Not difficult at all        01/17/2022   11:22 AM 08/09/2021    9:27 AM 04/16/2021    9:00 AM  GAD 7 : Generalized Anxiety Score  Nervous, Anxious, on Edge 0 1 0  Control/stop worrying 0 1 0  Worry too much - different things 0 0 0  Trouble relaxing 0 0 0  Restless 0 0 0  Easily annoyed or irritable 0 0 0  Afraid - awful might happen 0 0 0  Total GAD 7 Score 0 2 0  Anxiety Difficulty Not difficult at all  Not difficult at all     Review of Systems:   Pertinent items are noted in HPI Denies any headaches,  blurred vision, fatigue, shortness of breath, chest pain, abdominal pain, abnormal vaginal discharge/itching/odor/irritation, problems with periods, bowel movements, urination, or intercourse unless otherwise stated above. Pertinent History Reviewed:  Reviewed past medical,surgical, social and family history.  Reviewed problem list, medications and allergies. Physical Assessment:  There were no vitals filed for this visit.There is no height or weight on file to calculate BMI.        Physical Examination:   General appearance - well appearing, and in no distress  Mental status - alert, oriented to person, place, and time  Psych:  She has a normal mood and affect  Skin - warm and dry, normal color, no suspicious lesions noted  Chest - effort normal, all lung fields clear to auscultation bilaterally  Heart - normal rate and regular rhythm  Neck:  midline trachea, no thyromegaly or nodules  Breasts - breasts appear normal, no suspicious masses, no skin or nipple changes or  axillary nodes  Abdomen - soft, nontender, nondistended, no masses or organomegaly  Pelvic - VULVA: normal appearing vulva with no masses, tenderness or lesions  VAGINA: normal appearing vagina with normal color and discharge, no lesions  CERVIX: normal appearing cervix without discharge or lesions, no CMT  Thin prep pap is {Desc; done/not:10129} *** HR HPV cotesting  UTERUS:  uterus is felt to be normal size, shape, consistency and nontender   ADNEXA: No adnexal masses or tenderness noted.  Rectal - normal rectal, good sphincter tone, no masses felt. Hemoccult: ***  Extremities:  No swelling or varicosities noted  Chaperone present for exam  No results found for this or any previous visit (from the past 24 hours).  Assessment & Plan:  - Cervical cancer screening: Discussed screening Q3 years. Reviewed importance of annual exams and limits of pap smear. Pap with reflex HPV *** - GC/CT: Discussed and recommended. Pt  {Blank  single:19197::"accepts","declines"} - Gardasil: {Blank single:19197::"***","has not yet had. Will provide information","completed","has not yet had. Counseling provided and she declines","Has not yet had. Counseling provided and pt accepts"} - Birth Control: {Birth control type:23956} - Breast Health: Encouraged self breast awareness/exams. Teaching provided. - Mammogram: {Mammo f/u:25212::"@ 25yo"}, or sooner if problems - Colonoscopy: {TCS f/u:25213::"@ 25yo"}, or sooner if problems - Follow-up: 12 months and prn    No orders of the defined types were placed in this encounter.   Meds: No orders of the defined types were placed in this encounter.   Follow-up: No follow-ups on file.  Ralene Muskrat, [place] 04/13/2023 5:03 PM

## 2023-04-14 ENCOUNTER — Ambulatory Visit: Payer: No Typology Code available for payment source | Admitting: Physician Assistant

## 2023-04-14 ENCOUNTER — Encounter: Payer: Self-pay | Admitting: Physician Assistant

## 2023-04-14 ENCOUNTER — Other Ambulatory Visit (HOSPITAL_COMMUNITY)
Admission: RE | Admit: 2023-04-14 | Discharge: 2023-04-14 | Disposition: A | Discharge status: Home / Self Care | Payer: No Typology Code available for payment source | Source: Ambulatory Visit | Attending: Physician Assistant | Admitting: Physician Assistant

## 2023-04-14 VITALS — BP 110/82 | HR 77 | Ht 69.0 in | Wt 129.8 lb

## 2023-04-14 DIAGNOSIS — Z01419 Encounter for gynecological examination (general) (routine) without abnormal findings: Secondary | ICD-10-CM | POA: Insufficient documentation

## 2023-04-14 NOTE — Progress Notes (Signed)
Declines STD testing.Plans on a pap today. No c/o at this time.  BTL 8/23

## 2023-04-18 ENCOUNTER — Encounter: Payer: Self-pay | Admitting: Physician Assistant

## 2023-04-18 LAB — CYTOLOGY - PAP
Diagnosis: NEGATIVE
Diagnosis: REACTIVE
